# Patient Record
Sex: Female | Born: 1970 | Race: White | Hispanic: No | Smoking: Former smoker
Health system: Southern US, Community
[De-identification: ages and names within clinical notes are randomized; demographics above are authoritative.]

## PROBLEM LIST (undated history)

## (undated) DIAGNOSIS — J45909 Unspecified asthma, uncomplicated: Secondary | ICD-10-CM

## (undated) HISTORY — DX: Unspecified asthma, uncomplicated: J45.909

---

## 2004-10-02 ENCOUNTER — Other Ambulatory Visit: Admission: RE | Admit: 2004-10-02 | Discharge: 2004-10-02 | Payer: Self-pay | Admitting: Obstetrics and Gynecology

## 2005-04-20 ENCOUNTER — Ambulatory Visit (HOSPITAL_COMMUNITY): Admission: RE | Admit: 2005-04-20 | Discharge: 2005-04-20 | Payer: Self-pay | Admitting: Obstetrics and Gynecology

## 2010-01-20 ENCOUNTER — Encounter: Admission: RE | Admit: 2010-01-20 | Discharge: 2010-01-20 | Payer: Self-pay | Admitting: Family Medicine

## 2016-12-31 ENCOUNTER — Ambulatory Visit (INDEPENDENT_AMBULATORY_CARE_PROVIDER_SITE_OTHER): Payer: 59 | Admitting: Allergy and Immunology

## 2016-12-31 ENCOUNTER — Encounter: Payer: Self-pay | Admitting: Allergy and Immunology

## 2016-12-31 VITALS — BP 110/72 | HR 78 | Temp 98.4°F | Resp 16 | Ht 65.0 in | Wt 136.6 lb

## 2016-12-31 DIAGNOSIS — J3089 Other allergic rhinitis: Secondary | ICD-10-CM | POA: Diagnosis not present

## 2016-12-31 DIAGNOSIS — Z91018 Allergy to other foods: Secondary | ICD-10-CM | POA: Diagnosis not present

## 2016-12-31 MED ORDER — FLUTICASONE PROPIONATE 50 MCG/ACT NA SUSP: 2.0000 | Freq: Every day | NASAL | 5 refills | Status: DC | PRN

## 2016-12-31 NOTE — Progress Notes (Signed)
New Patient Note  RE: [name] MRN: [id number] DOB: 1970-10-25 Date of Office Visit: 12/31/2016  Referring provider: Charlies Silvers, PA* Primary care provider: Charlies Silvers, PA-C  Chief Complaint: Food Intolerance and Nasal Congestion   History of present illness: Lynn Mcdonald is a 46 y.o. female presenting today for evaluation of possible food allergies and rhinitis.  She complains of episodic abdominal pain as well as belching and occasional bloating.  She is unable to identify any specific food triggers for these GI symptoms, however is interested in assessing her food allergy status.  She reports a long time ago she may have experienced mild oral pruritus with the consumption of eggs.  She currently avoids eggs.  She experiences nasal congestion, sneezing, dry eyes, and occasional rhinorrhea and nasal pruritus.   Assessment and plan: History of food allergy Gastrointestinal symptoms, uncertain etiology. Skin tests to select food allergens were negative today. The negative predictive value of food allergen skin testing is excellent (approximately 95%). While this does not appear to be an IgE mediated issue, skin testing does not rule out food intolerances or cell-mediated enteropathies which may lend to GI symptoms. These etiologies are suggested when elimination of the responsible food leads to symptom resolution and re-introduction of the food is followed by the return of symptoms.   Open graded oral challenge for egg has been offered.  The patient has been encouraged to keep a careful symptom/food journal and eliminate any food suspected of correlating with symptoms.   If GI symptoms persist or progress, gastroenterologist evaluation may be warranted.  Perennial and seasonal allergic rhinitis  Aeroallergen avoidance measures have been discussed and provided in written form.  A prescription has been provided for fluticasone nasal spray, 2 sprays per nostril  daily as needed. Proper nasal spray technique has been discussed and demonstrated.  I have also recommended nasal saline spray (i.e., Simply Saline) or nasal saline lavage (i.e., NeilMed) as needed and prior to medicated nasal sprays.  For thick post nasal drainage, add guaifenesin [phone] mg (Mucinex)  twice daily as needed with adequate hydration as discussed.   Meds ordered this encounter  Medications  . fluticasone (FLONASE ALLERGY RELIEF) 50 MCG/ACT nasal spray    Sig: Place 2 sprays into both nostrils daily as needed for allergies or rhinitis.    Dispense:  16 g    Refill:  5    Diagnostics: Environmental epiutaneous testing:  Negative despite a positive histamine control. Environmental intradermal testing: Positive to tree pollen, molds, cockroach antigen, and dust mite antigen. Food allergen skin testing:  Negative despite a positive histamine control.    Physical examination: Blood pressure 110/72, pulse 78, temperature 98.4 F (36.9 C), temperature source Oral, resp. rate 16, height  (1.651 m), weight 136 lb 9.6 oz (62 kg), SpO2 97 %.  General: Alert, interactive, in no acute distress. HEENT: TMs pearly gray, turbinates moderately edematous without discharge, post-pharynx unremarkable. Neck: Supple without lymphadenopathy. Lungs: Clear to auscultation without wheezing, rhonchi or rales. CV: Normal S1, S2 without murmurs. Abdomen: Nondistended, nontender. Skin: Warm and dry, without lesions or rashes. Extremities:  No clubbing, cyanosis or edema. Neuro:   Grossly intact.  Review of systems:  Review of systems negative except as noted in HPI / PMHx or noted below: Review of Systems  Constitutional: Negative.   HENT: Negative.   Eyes: Negative.   Respiratory: Negative.   Cardiovascular: Negative.   Gastrointestinal: Negative.   Genitourinary: Negative.   Musculoskeletal: Negative.  Skin: Negative.   Neurological: Negative.   Endo/Heme/Allergies: Negative.    Psychiatric/Behavioral: Negative.     Past medical history:  Past Medical History:  Diagnosis Date  . Asthma    around 46 years old, and nothing since.    Past surgical history:  History reviewed. No pertinent surgical history.  Family history: Family History  Problem Relation Age of Onset  . Allergic rhinitis Neg Hx   . Angioedema Neg Hx   . Asthma Neg Hx   . Eczema Neg Hx   . Immunodeficiency Neg Hx   . Urticaria Neg Hx     Social history: Social History   Social History  . Marital status: Single    Spouse name: N/A  . Number of children: N/A  . Years of education: N/A   Occupational History  . Not on file.   Social History Main Topics  . Smoking status: Former Smoker    Quit date: 01/01/1995  . Smokeless tobacco: Never Used  . Alcohol use Not on file  . Drug use: Unknown  . Sexual activity: Not on file   Other Topics Concern  . Not on file   Social History Narrative  . No narrative on file   Environmental History: The patient lives in a 44 year old house with carpeting in the bedroom and central air/heat.  There are 2 cats in the house which have access to her bedroom.  She is a nonsmoker.  There is no known mold/water damage in the home.  Allergies as of 12/31/2016      Reactions   Eggs Or Egg-derived Products Itching   tongue   Sulfa Antibiotics Itching   tongue      Medication List       Accurate as of 12/31/16  9:23 PM. Always use your most recent med list.          fluticasone 50 MCG/ACT nasal spray Commonly known as:  FLONASE ALLERGY RELIEF Place 2 sprays into both nostrils daily as needed for allergies or rhinitis.            Discharge Care Instructions        Start     Ordered   12/31/16 0000  Allergy Test    Question:  Allergy test to perform  Answer:  1-59,1-66   12/31/16 1544   12/31/16 0000  Interdermal Allergy Test    Question Answer Comment  Allergens Control   Allergens [place]   Allergens Johnson   Allergens  7 Grass   Allergens Weed Mix   Allergens Tree Mix   Allergens Cockroach   Allergens Dog   Allergens Cat   Allergens Mold 4   Allergens Mold 3   Allergens Ragweed Mix   Allergens Mold 2   Allergens Mold 1   Allergens Mite Mix      12/31/16 1544   12/31/16 0000  fluticasone (FLONASE ALLERGY RELIEF) 50 MCG/ACT nasal spray  Daily PRN     12/31/16 1544      Known medication allergies: Allergies  Allergen Reactions  . Eggs Or Egg-Derived Products Itching    tongue  . Sulfa Antibiotics Itching    tongue    I appreciate the opportunity to take part in Nury's care. Please do not hesitate to contact me with questions.  Sincerely,   R. Jorene Guestarter Mitsue Peery, MD

## 2016-12-31 NOTE — Assessment & Plan Note (Addendum)
Gastrointestinal symptoms, uncertain etiology. Skin tests to select food allergens were negative today. The negative predictive value of food allergen skin testing is excellent (approximately 95%). While this does not appear to be an IgE mediated issue, skin testing does not rule out food intolerances or cell-mediated enteropathies which may lend to GI symptoms. These etiologies are suggested when elimination of the responsible food leads to symptom resolution and re-introduction of the food is followed by the return of symptoms.   Open graded oral challenge for egg has been offered.  The patient has been encouraged to keep a careful symptom/food journal and eliminate any food suspected of correlating with symptoms.   If GI symptoms persist or progress, gastroenterologist evaluation may be warranted.

## 2016-12-31 NOTE — Assessment & Plan Note (Signed)
   Aeroallergen avoidance measures have been discussed and provided in written form.  A prescription has been provided for fluticasone nasal spray, 2 sprays per nostril daily as needed. Proper nasal spray technique has been discussed and demonstrated.  I have also recommended nasal saline spray (i.e., Simply Saline) or nasal saline lavage (i.e., NeilMed) as needed and prior to medicated nasal sprays.  For thick post nasal drainage, add guaifenesin [phone] mg (Mucinex)  twice daily as needed with adequate hydration as discussed.

## 2016-12-31 NOTE — Patient Instructions (Addendum)
History of food allergy Gastrointestinal symptoms, uncertain etiology. Skin tests to select food allergens were negative today. The negative predictive value of food allergen skin testing is excellent (approximately 95%). While this does not appear to be an IgE mediated issue, skin testing does not rule out food intolerances or cell-mediated enteropathies which may lend to GI symptoms. These etiologies are suggested when elimination of the responsible food leads to symptom resolution and re-introduction of the food is followed by the return of symptoms.   Open graded oral challenge for egg has been offered.  The patient has been encouraged to keep a careful symptom/food journal and eliminate any food suspected of correlating with symptoms.   If GI symptoms persist or progress, gastroenterologist evaluation may be warranted.  Perennial and seasonal allergic rhinitis  Aeroallergen avoidance measures have been discussed and provided in written form.  A prescription has been provided for fluticasone nasal spray, 2 sprays per nostril daily as needed. Proper nasal spray technique has been discussed and demonstrated.  I have also recommended nasal saline spray (i.e., Simply Saline) or nasal saline lavage (i.e., NeilMed) as needed and prior to medicated nasal sprays.  For thick post nasal drainage, add guaifenesin [phone] mg (Mucinex)  twice daily as needed with adequate hydration as discussed.   Return if symptoms worsen or fail to improve.   Control of House Dust Mite Allergen  House dust mites play a major role in allergic asthma and rhinitis.  They occur in environments with high humidity wherever human skin, the food for dust mites is found. High levels have been detected in dust obtained from mattresses, pillows, carpets, upholstered furniture, bed covers, clothes and soft toys.  The principal allergen of the house dust mite is found in its feces.  A gram of dust may contain 1,000 mites and  250,000 fecal particles.  Mite antigen is easily measured in the air during house cleaning activities.    1. Encase mattresses, including the box spring, and pillow, in an air tight cover.  Seal the zipper end of the encased mattresses with wide adhesive tape. 2. Wash the bedding in water of 130 degrees Farenheit weekly.  Avoid cotton comforters/quilts and flannel bedding: the most ideal bed covering is the dacron comforter. 3. Remove all upholstered furniture from the bedroom. 4. Remove carpets, carpet padding, rugs, and non-washable window drapes from the bedroom.  Wash drapes weekly or use plastic window coverings. 5. Remove all non-washable stuffed toys from the bedroom.  Wash stuffed toys weekly. 6. Have the room cleaned frequently with a vacuum cleaner and a damp dust-mop.  The patient should not be in a room which is being cleaned and should wait 1 hour after cleaning before going into the room. 7. Close and seal all heating outlets in the bedroom.  Otherwise, the room will become filled with dust-laden air.  An electric heater can be used to heat the room. 8. Reduce indoor humidity to less than 50%.  Do not use a humidifier.   Reducing Pollen Exposure  The American Academy of Allergy, Asthma and Immunology suggests the following steps to reduce your exposure to pollen during allergy seasons.    1. Do not hang sheets or clothing out to dry; pollen may collect on these items. 2. Do not mow lawns or spend time around freshly cut grass; mowing stirs up pollen. 3. Keep windows closed at night.  Keep car windows closed while driving. 4. Minimize morning activities outdoors, a time when pollen counts are  usually at their highest. 5. Stay indoors as much as possible when pollen counts or humidity is high and on windy days when pollen tends to remain in the air longer. 6. Use air conditioning when possible.  Many air conditioners have filters that trap the pollen spores. 7. Use a HEPA room air  filter to remove pollen form the indoor air you breathe.   Control of Mold Allergen  Mold and fungi can grow on a variety of surfaces provided certain temperature and moisture conditions exist.  Outdoor molds grow on plants, decaying vegetation and soil.  The major outdoor mold, Alternaria and Cladosporium, are found in very high numbers during hot and dry conditions.  Generally, a late Summer - Fall peak is seen for common outdoor fungal spores.  Rain will temporarily lower outdoor mold spore count, but counts rise rapidly when the rainy period ends.  The most important indoor molds are Aspergillus and Penicillium.  Dark, humid and poorly ventilated basements are ideal sites for mold growth.  The next most common sites of mold growth are the bathroom and the kitchen.  Outdoor [employer] 1. Use air conditioning and keep windows closed 2. Avoid exposure to decaying vegetation. 3. Avoid leaf raking. 4. Avoid grain handling. 5. Consider wearing a face mask if working in moldy areas.  Indoor Mold Control 1. Maintain humidity below 50%. 2. Clean washable surfaces with 5% bleach solution. 3. Remove sources e.g. Contaminated carpets.  Control of Cockroach Allergen  Cockroach allergen has been identified as an important cause of acute attacks of asthma, especially in urban settings.  There are fifty-five species of cockroach that exist in the [place], however only three, the [place], [place] species produce allergen that can affect patients with Asthma.  Allergens can be obtained from fecal particles, egg casings and secretions from cockroaches.    1. Remove food sources. 2. Reduce access to water. 3. Seal access and entry points. 4. Spray runways with 0.5-1% Diazinon or Chlorpyrifos 5. Blow boric acid power under stoves and refrigerator. 6. Place bait stations (hydramethylnon) at feeding sites.

## 2019-09-04 ENCOUNTER — Other Ambulatory Visit: Payer: Self-pay | Admitting: Family Medicine

## 2019-09-04 ENCOUNTER — Other Ambulatory Visit: Payer: Self-pay

## 2019-09-04 ENCOUNTER — Ambulatory Visit
Admission: RE | Admit: 2019-09-04 | Discharge: 2019-09-04 | Disposition: A | Discharge status: Home / Self Care | Payer: 59 | Source: Ambulatory Visit | Attending: Family Medicine | Admitting: Family Medicine

## 2019-09-04 DIAGNOSIS — M545 Low back pain, unspecified: Secondary | ICD-10-CM

## 2021-08-18 ENCOUNTER — Inpatient Hospital Stay (HOSPITAL_BASED_OUTPATIENT_CLINIC_OR_DEPARTMENT_OTHER)
Admission: EM | Admit: 2021-08-18 | Discharge: 2021-08-20 | DRG: 684 | Disposition: A | Discharge status: Home / Self Care | Payer: 59 | Attending: Student | Admitting: Student

## 2021-08-18 ENCOUNTER — Observation Stay (HOSPITAL_COMMUNITY): Payer: 59

## 2021-08-18 ENCOUNTER — Emergency Department (HOSPITAL_BASED_OUTPATIENT_CLINIC_OR_DEPARTMENT_OTHER): Payer: 59 | Admitting: Radiology

## 2021-08-18 ENCOUNTER — Encounter (HOSPITAL_BASED_OUTPATIENT_CLINIC_OR_DEPARTMENT_OTHER): Payer: Self-pay | Admitting: Emergency Medicine

## 2021-08-18 ENCOUNTER — Other Ambulatory Visit: Payer: Self-pay

## 2021-08-18 DIAGNOSIS — F4320 Adjustment disorder, unspecified: Secondary | ICD-10-CM | POA: Diagnosis not present

## 2021-08-18 DIAGNOSIS — N179 Acute kidney failure, unspecified: Principal | ICD-10-CM | POA: Diagnosis present

## 2021-08-18 DIAGNOSIS — R7989 Other specified abnormal findings of blood chemistry: Secondary | ICD-10-CM

## 2021-08-18 DIAGNOSIS — R319 Hematuria, unspecified: Secondary | ICD-10-CM | POA: Diagnosis present

## 2021-08-18 DIAGNOSIS — W57XXXA Bitten or stung by nonvenomous insect and other nonvenomous arthropods, initial encounter: Secondary | ICD-10-CM | POA: Diagnosis present

## 2021-08-18 DIAGNOSIS — Z634 Disappearance and death of family member: Secondary | ICD-10-CM | POA: Diagnosis not present

## 2021-08-18 DIAGNOSIS — J302 Other seasonal allergic rhinitis: Secondary | ICD-10-CM | POA: Diagnosis not present

## 2021-08-18 DIAGNOSIS — Z79899 Other long term (current) drug therapy: Secondary | ICD-10-CM | POA: Diagnosis not present

## 2021-08-18 DIAGNOSIS — Z882 Allergy status to sulfonamides status: Secondary | ICD-10-CM | POA: Diagnosis not present

## 2021-08-18 DIAGNOSIS — Z86718 Personal history of other venous thrombosis and embolism: Secondary | ICD-10-CM | POA: Diagnosis not present

## 2021-08-18 DIAGNOSIS — I824Z2 Acute embolism and thrombosis of unspecified deep veins of left distal lower extremity: Secondary | ICD-10-CM

## 2021-08-18 DIAGNOSIS — Z91012 Allergy to eggs: Secondary | ICD-10-CM

## 2021-08-18 DIAGNOSIS — D649 Anemia, unspecified: Secondary | ICD-10-CM

## 2021-08-18 DIAGNOSIS — F4329 Adjustment disorder with other symptoms: Secondary | ICD-10-CM

## 2021-08-18 DIAGNOSIS — Z87891 Personal history of nicotine dependence: Secondary | ICD-10-CM

## 2021-08-18 LAB — CBC WITH DIFFERENTIAL/PLATELET
Abs Immature Granulocytes: 0.03 10*3/uL (ref 0.00–0.07)
Basophils Absolute: 0 10*3/uL (ref 0.0–0.1)
Basophils Relative: 0 %
Eosinophils Absolute: 0.1 10*3/uL (ref 0.0–0.5)
Eosinophils Relative: 1 %
HCT: 32.8 % — ABNORMAL LOW (ref 36.0–46.0)
Hemoglobin: 10.9 g/dL — ABNORMAL LOW (ref 12.0–15.0)
Immature Granulocytes: 0 %
Lymphocytes Relative: 11 %
Lymphs Abs: 0.8 10*3/uL (ref 0.7–4.0)
MCH: 30.6 pg (ref 26.0–34.0)
MCHC: 33.2 g/dL (ref 30.0–36.0)
MCV: 92.1 fL (ref 80.0–100.0)
Monocytes Absolute: 0.4 10*3/uL (ref 0.1–1.0)
Monocytes Relative: 7 %
Neutro Abs: 5.3 10*3/uL (ref 1.7–7.7)
Neutrophils Relative %: 81 %
Platelets: 237 10*3/uL (ref 150–400)
RBC: 3.56 MIL/uL — ABNORMAL LOW (ref 3.87–5.11)
RDW: 11.4 % — ABNORMAL LOW (ref 11.5–15.5)
WBC: 6.7 10*3/uL (ref 4.0–10.5)
nRBC: 0 % (ref 0.0–0.2)

## 2021-08-18 LAB — URINALYSIS, ROUTINE W REFLEX MICROSCOPIC
Bilirubin Urine: NEGATIVE
Glucose, UA: NEGATIVE mg/dL
Ketones, ur: NEGATIVE mg/dL
Nitrite: NEGATIVE
Protein, ur: 30 mg/dL — AB
Specific Gravity, Urine: 1.008 (ref 1.005–1.030)
pH: 7.5 (ref 5.0–8.0)

## 2021-08-18 LAB — COMPREHENSIVE METABOLIC PANEL
ALT: 9 U/L (ref 0–44)
AST: 18 U/L (ref 15–41)
Albumin: 3.4 g/dL — ABNORMAL LOW (ref 3.5–5.0)
Alkaline Phosphatase: 109 U/L (ref 38–126)
Anion gap: 10 (ref 5–15)
BUN: 36 mg/dL — ABNORMAL HIGH (ref 6–20)
CO2: 23 mmol/L (ref 22–32)
Calcium: 9.5 mg/dL (ref 8.9–10.3)
Chloride: 100 mmol/L (ref 98–111)
Creatinine, Ser: 2.31 mg/dL — ABNORMAL HIGH (ref 0.44–1.00)
GFR, Estimated: 25 mL/min — ABNORMAL LOW (ref >60)
Glucose, Bld: 125 mg/dL — ABNORMAL HIGH (ref 70–99)
Potassium: 4.7 mmol/L (ref 3.5–5.1)
Sodium: 133 mmol/L — ABNORMAL LOW (ref 135–145)
Total Bilirubin: 0.5 mg/dL (ref 0.3–1.2)
Total Protein: 6.7 g/dL (ref 6.5–8.1)

## 2021-08-18 LAB — PREGNANCY, URINE: Preg Test, Ur: NEGATIVE

## 2021-08-18 LAB — D-DIMER, QUANTITATIVE: D-Dimer, Quant: 1.91 ug/mL-FEU — ABNORMAL HIGH (ref 0.00–0.50)

## 2021-08-18 LAB — SEDIMENTATION RATE: Sed Rate: 40 mm/hr — ABNORMAL HIGH (ref 0–22)

## 2021-08-18 MED ORDER — ACETAMINOPHEN 650 MG RE SUPP: 650.0000 mg | Freq: Four times a day (QID) | RECTAL | Status: DC | PRN

## 2021-08-18 MED ORDER — SODIUM CHLORIDE 0.9 % IV SOLN: INTRAVENOUS | Status: AC

## 2021-08-18 MED ORDER — LACTATED RINGERS IV BOLUS
1000.0000 mL | Freq: Once | INTRAVENOUS | Status: AC
  Administered 2021-08-18: 1000 mL via INTRAVENOUS

## 2021-08-18 MED ORDER — ACETAMINOPHEN 500 MG PO TABS
1000.0000 mg | ORAL_TABLET | Freq: Four times a day (QID) | ORAL | Status: DC | PRN
  Administered 2021-08-19: 1000 mg via ORAL
  Administered 2021-08-19 – 2021-08-20 (×3): 500 mg via ORAL
  Filled 2021-08-18 (×5): qty 2

## 2021-08-18 MED ORDER — DOXYCYCLINE HYCLATE 100 MG PO TABS
100.0000 mg | ORAL_TABLET | Freq: Two times a day (BID) | ORAL | Status: DC
Start: 2021-08-19 — End: 2021-08-19

## 2021-08-18 MED ORDER — ONDANSETRON HCL 4 MG/2ML IJ SOLN: 4.0000 mg | Freq: Four times a day (QID) | INTRAMUSCULAR | Status: DC | PRN

## 2021-08-18 MED ORDER — ENOXAPARIN SODIUM 30 MG/0.3ML IJ SOSY
30.0000 mg | PREFILLED_SYRINGE | INTRAMUSCULAR | Status: DC
  Administered 2021-08-18 – 2021-08-19 (×2): 30 mg via SUBCUTANEOUS
  Filled 2021-08-18 (×2): qty 0.3

## 2021-08-18 MED ORDER — LACTATED RINGERS IV SOLN: INTRAVENOUS | Status: DC

## 2021-08-18 MED ORDER — DOXYCYCLINE HYCLATE 100 MG PO TABS
200.0000 mg | ORAL_TABLET | Freq: Once | ORAL | Status: AC
  Administered 2021-08-18: 200 mg via ORAL
  Filled 2021-08-18: qty 2

## 2021-08-18 MED ORDER — SENNOSIDES-DOCUSATE SODIUM 8.6-50 MG PO TABS: 1.0000 | ORAL_TABLET | Freq: Every evening | ORAL | Status: DC | PRN

## 2021-08-18 MED ORDER — ONDANSETRON HCL 4 MG PO TABS: 4.0000 mg | ORAL_TABLET | Freq: Four times a day (QID) | ORAL | Status: DC | PRN

## 2021-08-18 NOTE — ED Notes (Signed)
Report given to the Floor RN. 

## 2021-08-18 NOTE — H&P (Signed)
?History and Physical  ? ? ?Lynn Mcdonald YPP:[id number] DOB: [birth year] DOA: 08/18/2021 ? ?PCP: Lazoff, Lynn Mcdonald  ?Patient coming from: Home ? ?I have personally briefly reviewed patient's old medical records in [place] ? ?Chief Complaint: AKI, fevers, joint pain ? ?HPI: ?Lynn Mcdonald is a 51 y.o. female with medical history significant for childhood asthma and seasonal allergies who presented to the ED for evaluation of AKI. ? ?Patient initially was seen at urgent care 07/17/2021 as a telemedicine visit for 2 days of fever followed by new nonpruritic rash which began on her thighs and spread to her chest and back of the arms.  She had reported coughing with intermittent wheezing as well without shortness of breath.  Symptoms were felt related to viral URI with viral exanthem.  She was recommended to continue supportive care. ? ?She was seen by her PCP on 08/02/2021 for continued symptoms and recurrent fever.  She was started on a 10-day course of doxycycline to treat sinusitis.  She does note that she did have photosensitivity as well as nausea while on doxycycline. ? ?On 4/21 she called her PCP with persistent symptoms after completing course of doxycycline.  She was then prescribed a 7-day course of Augmentin. ? ?She was seen again in office with her PCP on 4/27.  At this time she reported that she found a tick on her previous night.  She had continued low-grade fevers, polyarthralgia, poor appetite.  Monospot test was negative.  ESR 37, CRP 17.3.  Creatinine was found to be 2.64 compared to previous 0.72 (12/14/2019).  She was called today after labs resulted to present to the ED for further evaluation. ? ?Patient states that her rash has completely resolved now.  She says she found a tick in her left posterior upper leg/gluteal area 2 nights ago, she is not sure how long tick was attached.  She still has migrating joint pain and low-grade fevers with chills.  She still has poor appetite.  She denies  any decrease in urine output or dysuria.  She denies any emesis, abdominal pain, or peripheral edema.  She denies any recent travel.  She has 2 pet cats. ? ?[place] ED Course  Labs/Imaging on admission: I have personally reviewed following labs and imaging studies. ? ?Initial vitals showed BP 152/64, pulse 106, RR 18, temp 98.2 ?F, SPO2 100% on room air. ? ?Labs show BUN 36, creatinine 2.31, serum glucose 125, sodium 133, potassium 4.7, bicarb 23, LFTs within normal limits, WBC 6.7, hemoglobin 10.9, platelets 237,000, D-dimer 1.91, ESR 40. ? ?Urine pregnancy test negative.  Urinalysis shows negative nitrates, trace leukocytes, 21-50 RBC/hpf, 0-5 WBC/hpf. ? ?2 view chest x-ray negative for focal consolidation, edema, effusion. ? ?Patient was given 1 L LR and the hospitalist service was consulted to admit for further evaluation and management. ? ?Review of Systems: All systems reviewed and are negative except as documented in history of present illness above. ? ? ?Past Medical History:  ?Diagnosis Date  ? Asthma   ? around 51 years old, and nothing since.  ? ? ?History reviewed. No pertinent surgical history. ? ?Social History: ? reports that she quit smoking about 26 years ago. She has never used smokeless tobacco. No history on file for alcohol use and drug use. ? ?Allergies  ?Allergen Reactions  ? Eggs Or Egg-Derived Products Itching  ?  tongue  ? Sulfa Antibiotics Itching  ?  tongue  ? ? ?Family History  ?Problem Relation  Age of Onset  ? Allergic rhinitis Neg Hx   ? Angioedema Neg Hx   ? Asthma Neg Hx   ? Eczema Neg Hx   ? Immunodeficiency Neg Hx   ? Urticaria Neg Hx   ? ? ? ?Prior to Admission medications   ?Medication Sig Start Date End Date Taking? Authorizing Provider  ?fluticasone (FLONASE ALLERGY RELIEF) 50 MCG/ACT nasal spray Place 2 sprays into both nostrils daily as needed for allergies or rhinitis. 12/31/16   Bobbitt, Sedalia Muta, MD  ? ? ?Physical Exam: ?Vitals:  ? 08/18/21 1600 08/18/21  1700 08/18/21 1832 08/18/21 2002  ?BP: 135/75 132/62 (!) 135/55 (!) 132/59  ?Pulse: 90 90 96 90  ?Resp: 20 18 18 19   ?Temp:   98.7 ?F (37.1 ?C) 99.2 ?F (37.3 ?C)  ?TempSrc:   Oral Oral  ?SpO2: 97% 98% 100% 100%  ?Weight:   60.3 kg   ?Height:   5' 3"  (1.6 m)   ? ?Constitutional: Resting in bed, NAD, calm, comfortable ?Eyes: EOMI, lids and conjunctivae normal ?ENMT: Mucous membranes are moist. Posterior pharynx clear of any exudate or lesions.Normal dentition.  ?Neck: normal, supple, no masses. ?Respiratory: clear to auscultation bilaterally, no wheezing, no crackles. Normal respiratory effort. No accessory muscle use.  ?Cardiovascular: Regular rate and rhythm, no murmurs / rubs / gallops. No extremity edema. 2+ pedal pulses. ?Abdomen: no tenderness, no masses palpated. No hepatosplenomegaly. Bowel sounds positive.  ?Musculoskeletal: no clubbing / cyanosis. No joint deformity upper and lower extremities. Good ROM, no contractures. Normal muscle tone.  ?Skin: no rashes, lesions, ulcers. No induration ?Neurologic: Sensation intact. Strength 5/5 in all 4.  ?Psychiatric: Normal judgment and insight. Alert and oriented x 3. Normal mood.  ? ?EKG: Personally reviewed. Normal sinus rhythm without acute ischemic changes.  No prior for comparison. ? ?Assessment/Plan ?Principal Problem: ?  AKI (acute kidney injury) ([place]) ?Active Problems: ?  Tick-borne illness ?  Elevated d-dimer ?  ?Lynn Mcdonald is a 51 y.o. female with medical history significant for childhood asthma and seasonal allergies who is admitted with AKI and suspected tickborne illness. ? ?Assessment and Plan: ?* AKI (acute kidney injury) ([place]) ?Creatinine 2.31 on admission, last creatinine was 0.72 in August 2021.  Suspect some degree of hypovolemia due to poor oral intake as well as possible ATN in setting of presumed tickborne illness. ?-Continue IV fluid hydration overnight ?-Obtain renal ultrasound ?-Check urine sodium and creatinine ?-Avoid NSAIDs ?-Repeat  labs in a.m. ? ?Tick-borne illness ?Patient with initial fevers followed by petechial rash (involving thighs, chest, posterior arms) which has since resolved.  Has intermittent recurrent fevers and migrating polyarthralgia.  She found a tick on her person 2 nights prior to admission.  Clinical picture suspicious for tickborne illness such as [facility] spotted fever.  She denies any recent travel. ?-Give doxycycline 200 mg once followed by 100 mg twice daily ?-Follow RMSF labs ? ?Elevated d-dimer ?D-dimer elevated at 1.91.  Likely related to presumed tickborne illness with polyarthralgia.  She denies any dyspnea and is not tachycardic, low suspicion for PE at this time. ? ?DVT prophylaxis: enoxaparin (LOVENOX) injection 30 mg Start: 08/18/21 2200 ?Code Status: Full code, confirmed on admission. ?Family Communication: Discussed with patient's friend at bedside. ?Disposition Plan: From home and likely discharge to home pending clinical progress. ?Consults called: None ?Severity of Illness: ?The appropriate patient status for this patient is OBSERVATION. Observation status is judged to be reasonable and necessary in order to provide the required intensity of  service to ensure the patient's safety. The patient's presenting symptoms, physical exam findings, and initial radiographic and laboratory data in the context of their medical condition is felt to place them at decreased risk for further clinical deterioration. Furthermore, it is anticipated that the patient will be medically stable for discharge from the hospital within 2 midnights of admission.   ?Zada Finders MD ?[facility] ? ?If 7PM-7AM, please contact night-coverage ?[email or web address] ? ?08/18/2021, 8:05 PM  ?

## 2021-08-18 NOTE — Hospital Course (Addendum)
51 year old F with PMH of childhood asthma and seasonal allergies directed to ED by PCP due to AKI.  Creatinine 2.6 at PCP office.  Her creatinine of 0.72 in 11/2019.  Suspect some element of CKD based on renal US.  Recently treated for possible sinusitis and viral URI with doxycycline and then with Augmentin.  There is also concern about tick bite disease.  She had elevated inflammatory markers.  Seems Lyme and RMSF serologies were ordered by PCP.  ? ?Creatinine improved to 2.0 with IV fluid.  She has large Hgb with 20-50 RBC per hpf on UA.  Nephrology consulted, and ordered autoimmune and inflammatory labs for further evaluation of AKI and possible CKD.  Nephrology to arrange outpatient follow-up in 2 weeks. ? ?Of note, patient reports a lot of stress lately both at work and home.  She says she lost her husband about a year ago.  She is a single mom.  She says she might be depressed but does not have suicidal or homicidal thought.  She says her friend advised her to seek therapy that she did not follow through.  She also seems to have some menopausal signs.  Obviously, current diagnosis of renal failure and DVT did not help.  She seems overwhelmed.  I have encouraged her to talk to PCP.  I think she would benefit from therapy at minimum.  SSRI could help as well. ? ?

## 2021-08-18 NOTE — Assessment & Plan Note (Addendum)
Recent Labs  ?  08/18/21 ?1435 08/19/21 ?0439 08/20/21 ?0427  ?BUN 36* 30* 30*  ?CREATININE 2.31* 2.14* 2.03*  ?Unknown baseline. Cr 2.64 at PCP office on 4/27, was 0.72 in August 2021.  Not on nephrotoxic meds except for occasional NSAID.  Recently had some constitutional symptoms with subjective fever, chills, arthralgia, hot flash... She also have elevated inflammatory markers.  Seen by PCP recently and diagnosed with URI with viral exanthem and sinusitis for which she was treated with doxycycline and then Augmentin.  UA with large Hgb 20-50 RBCs per hpf.  No proteinuria.  CK within normal.  Renal US suggested medical renal disease.  Evaluated by nephrology.  Autoimmune and inflammatory labs ordered.  Nephrology to follow-up on these labs and arrange outpatient follow-up in 2 weeks. ?

## 2021-08-18 NOTE — ED Triage Notes (Signed)
Pt reports being treated for (unknown) infection for past 2 weeks, and reports continued fatigue and low appetite.  Pt currently finishing course of amoxiclave.  Pt had blood drawn yesterday and reports she was notified of abnormal kidney function values and advised to report to ED for eval. ?

## 2021-08-18 NOTE — Assessment & Plan Note (Deleted)
No calf tenderness or respiratory symptoms prior to admission.  However, she felt left-sided chest pain with deep breathing last night.  No prior history of blood clot.  Not on HRT.  No history of cancer.  No recent surgery, long travel or immobilization.  LE Doppler  with age indeterminate deep vein thrombosis  ?involving the left peroneal veins.  VQ scan negative but cannot exclude small peripheral PEs. We have discussed about Doppler finding.  Although this does not warrant treatment, her risk of VTE is higher than normal in the future, and VTE prophylaxis with low-dose Xarelto would be reasonable. I have also recommended age-appropriate cancer screenings such as mammogram, Pap smear and colonoscopy. ?

## 2021-08-18 NOTE — ED Provider Notes (Signed)
?[facility] ?Provider Note ? ? ?CSN: [id number] ?Arrival date & time: 08/18/21  1346 ? ?  ? ?History ? ?Chief Complaint  ?Patient presents with  ? Fatigue  ? ? ?Lynn Mcdonald is a 51 y.o. female referred to the ED by her PCP for evaluation of elevated inflammatory markers and creatinine level.  Patient first went to her primary care doctor about 1.5 to 2 months ago for evaluation of multiple nonspecific complaints including night sweats, fevers, decreased appetite and rash across her anterior bilateral thighs, bilateral upper extremities and chest, sparing the palms and soles.  She had negative COVID test and symptoms were attributed to unknown viral etiology.  She was started on a 10-day course of doxycycline which she completed although did have some sun sensitivity exposure.  She was then given a 7-day course of Augmentin and she has 2 days left of this antibiotic remaining.  She continues to complain of decreased appetite, night sweats and "just not feeling well" despite the 2 courses of antibiotics.  At her recheck yesterday, labs identified elevated ESR, CRP, alk phos and creatinine of 2.46.  Patient was also noted to have find a tick on her body 2 days ago as well but is unsure how long it had been there.  Patient denies difficulty breathing, although she does note that she feels that she needs to take deeper breaths in order to feel as if she is getting enough air.  Currently she denies chest pain, abdominal pain, nausea, vomiting, diarrhea, lower extremity edema, urinary symptoms. ? ?HPI ? ?  ? ?Home Medications ?Prior to Admission medications   ?Medication Sig Start Date End Date Taking? Authorizing Provider  ?amoxicillin-clavulanate (AUGMENTIN) 875-125 MG tablet Take 1 tablet by mouth 2 (two) times daily. 08/11/21  Yes [provider]  ?naproxen sodium (ALEVE) 220 MG tablet Take 220 mg by mouth daily as needed (pain).   Yes [provider]   ?promethazine-dextromethorphan (PROMETHAZINE-DM) 6.25-15 MG/5ML syrup Take 5 mLs by mouth every 6 (six) hours as needed for cough. 07/17/21  Yes [provider]  ?fluticasone (FLONASE ALLERGY RELIEF) 50 MCG/ACT nasal spray Place 2 sprays into both nostrils daily as needed for allergies or rhinitis. ?Patient not taking: Reported on 08/18/2021 12/31/16   Bobbitt, Sedalia Muta, MD  ?   ? ?Allergies    ?Eggs or egg-derived products and Sulfa antibiotics   ? ?Review of Systems   ?Review of Systems ? ?Physical Exam ?Updated Vital Signs ?BP (!) 132/59 (BP Location: Left Arm)   Pulse 90   Temp 99.2 ?F (37.3 ?C) (Oral)   Resp 19   Ht 5' 3"  (1.6 m)   Wt 60.3 kg   SpO2 100%   BMI 23.54 kg/m?  ?Physical Exam ?Vitals and nursing note reviewed.  ?Constitutional:   ?   General: She is not in acute distress. ?   Appearance: She is not ill-appearing.  ?HENT:  ?   Head: Atraumatic.  ?Eyes:  ?   Conjunctiva/sclera: Conjunctivae normal.  ?Cardiovascular:  ?   Rate and Rhythm: Regular rhythm. Tachycardia present.  ?   Pulses:     ?     Radial pulses are 2+ on the right side and 1+ on the left side.  ?     Dorsalis pedis pulses are 2+ on the right side and 2+ on the left side.  ?   Heart sounds: No murmur heard. ?Pulmonary:  ?   Effort: Pulmonary effort is normal. No  respiratory distress.  ?   Breath sounds: Normal breath sounds.  ?Abdominal:  ?   General: Abdomen is flat. There is no distension.  ?   Palpations: Abdomen is soft.  ?   Tenderness: There is no abdominal tenderness.  ?Musculoskeletal:     ?   General: Normal range of motion.  ?   Cervical back: Normal range of motion.  ?   Right lower leg: No edema.  ?   Left lower leg: No edema.  ?Skin: ?   General: Skin is warm and dry.  ?   Capillary Refill: Capillary refill takes less than 2 seconds.  ?   Comments: Skin without rashes or lesions  ?Neurological:  ?   General: No focal deficit present.  ?   Mental Status: She is alert.  ?Psychiatric:     ?   Mood and Affect:  Mood normal.  ? ? ?ED Results / Procedures / Treatments   ?Labs ?(all labs ordered are listed, but only abnormal results are displayed) ?Labs Reviewed  ?URINALYSIS, ROUTINE W REFLEX MICROSCOPIC - Abnormal; Notable for the following components:  ?    Result Value  ? Hgb urine dipstick LARGE (*)   ? Protein, ur 30 (*)   ? Leukocytes,Ua TRACE (*)   ? All other components within normal limits  ?COMPREHENSIVE METABOLIC PANEL - Abnormal; Notable for the following components:  ? Sodium 133 (*)   ? Glucose, Bld 125 (*)   ? BUN 36 (*)   ? Creatinine, Ser 2.31 (*)   ? Albumin 3.4 (*)   ? GFR, Estimated 25 (*)   ? All other components within normal limits  ?D-DIMER, QUANTITATIVE - Abnormal; Notable for the following components:  ? D-Dimer, Quant 1.91 (*)   ? All other components within normal limits  ?CBC WITH DIFFERENTIAL/PLATELET - Abnormal; Notable for the following components:  ? RBC 3.56 (*)   ? Hemoglobin 10.9 (*)   ? HCT 32.8 (*)   ? RDW 11.4 (*)   ? All other components within normal limits  ?SEDIMENTATION RATE - Abnormal; Notable for the following components:  ? Sed Rate 40 (*)   ? All other components within normal limits  ?PREGNANCY, URINE  ?CBC WITH DIFFERENTIAL/PLATELET  ?C-REACTIVE PROTEIN  ?ROCKY MTN SPOTTED FVR ABS PNL(IGG+IGM)  ?HIV ANTIBODY (ROUTINE TESTING W REFLEX)  ?COMPREHENSIVE METABOLIC PANEL  ?CBC  ?SODIUM, URINE, RANDOM  ?CREATININE, URINE, RANDOM  ? ? ?EKG ?EKG Interpretation ? ?Date/Time:  Friday August 18 2021 14:41:02 EDT ?Ventricular Rate:  90 ?PR Interval:  148 ?QRS Duration: 79 ?QT Interval:  332 ?QTC Calculation: 407 ?R Axis:   73 ?Text Interpretation: Sinus rhythm Confirmed by Lennice Sites [phone]) on 08/18/2021 2:48:01 PM ? ?Radiology ?DG Chest 2 View ? ?Result Date: 08/18/2021 ?CLINICAL DATA:  Fatigue, concern for infection EXAM: CHEST - 2 VIEW COMPARISON:  None. FINDINGS: The heart size and mediastinal contours are within normal limits. Both lungs are clear. The visualized skeletal structures  are unremarkable. IMPRESSION: No active cardiopulmonary disease. Electronically Signed   By: Jerilynn Mages.  Shick M.D.   On: 08/18/2021 14:58   ? ?Procedures ?Procedures  ? ? ?Medications Ordered in ED ?Medications  ?0.9 %  sodium chloride infusion (has no administration in time range)  ?enoxaparin (LOVENOX) injection 30 mg (has no administration in time range)  ?acetaminophen (TYLENOL) tablet 1,000 mg (has no administration in time range)  ?  Or  ?acetaminophen (TYLENOL) suppository 650 mg (has no administration in time range)  ?  ondansetron (ZOFRAN) tablet 4 mg (has no administration in time range)  ?  Or  ?ondansetron (ZOFRAN) injection 4 mg (has no administration in time range)  ?senna-docusate (Senokot-S) tablet 1 tablet (has no administration in time range)  ?doxycycline (VIBRA-TABS) tablet 200 mg (has no administration in time range)  ?  Followed by  ?doxycycline (VIBRA-TABS) tablet 100 mg (has no administration in time range)  ?lactated ringers bolus 1,000 mL (0 mLs Intravenous Stopped 08/18/21 1759)  ? ? ?ED Course/ Medical Decision Making/ A&P ?  ?                        ?Medical Decision Making ?Amount and/or Complexity of Data Reviewed ?Labs: ordered. ?Radiology: ordered. ? ?Risk ?Decision regarding hospitalization. ? ? ?History:  ?Per HPI ?Social determinants of health:  ?Social History  ? ?Socioeconomic History  ? Marital status: Widowed  ?  Spouse name: Not on file  ? Number of children: Not on file  ? Years of education: Not on file  ? Highest education level: Not on file  ?Occupational History  ? Not on file  ?Tobacco Use  ? Smoking status: Former  ?  Types: Cigarettes  ?  Quit date: 01/01/1995  ?  Years since quitting: 26.6  ? Smokeless tobacco: Never  ?Substance and Sexual Activity  ? Alcohol use: Not on file  ? Drug use: Not on file  ? Sexual activity: Not on file  ?Other Topics Concern  ? Not on file  ?Social History Narrative  ? Not on file  ? ?Social Determinants of Health  ? ?Financial Resource Strain: Not  on file  ?Food Insecurity: Not on file  ?Transportation Needs: Not on file  ?Physical Activity: Not on file  ?Stress: Not on file  ?Social Connections: Not on file  ?Intimate Partner Violence: Not on file  ? ? ? ?Initial impr

## 2021-08-18 NOTE — ED Notes (Signed)
Report given to [facility]. 

## 2021-08-18 NOTE — ED Notes (Signed)
Pt encouraged to give a UA sample. Pt states, "I went before I came back here. I either need water or IV fluids". LR bolus started ?

## 2021-08-18 NOTE — Assessment & Plan Note (Addendum)
Unlikely.  She found a tick on her person 2 nights prior to admission.  The tick was small and not engorged.  She had constitutional symptoms before tick bite.  Some of her symptoms seems to be menopausal.  She also have age-indeterminate distal DVT.  ?-Follow serologies for Lyme and RMSF from PCP office ?-Discontinued doxycycline ?-Follow-up autoimmune and inflammatory labs ordered by nephrology ?

## 2021-08-19 ENCOUNTER — Observation Stay (HOSPITAL_COMMUNITY): Payer: 59

## 2021-08-19 DIAGNOSIS — R7989 Other specified abnormal findings of blood chemistry: Secondary | ICD-10-CM

## 2021-08-19 DIAGNOSIS — R319 Hematuria, unspecified: Secondary | ICD-10-CM | POA: Diagnosis present

## 2021-08-19 DIAGNOSIS — Z86718 Personal history of other venous thrombosis and embolism: Secondary | ICD-10-CM | POA: Diagnosis not present

## 2021-08-19 DIAGNOSIS — I824Z2 Acute embolism and thrombosis of unspecified deep veins of left distal lower extremity: Secondary | ICD-10-CM

## 2021-08-19 DIAGNOSIS — Z882 Allergy status to sulfonamides status: Secondary | ICD-10-CM | POA: Diagnosis not present

## 2021-08-19 DIAGNOSIS — F4320 Adjustment disorder, unspecified: Secondary | ICD-10-CM | POA: Diagnosis present

## 2021-08-19 DIAGNOSIS — Z634 Disappearance and death of family member: Secondary | ICD-10-CM | POA: Diagnosis not present

## 2021-08-19 DIAGNOSIS — J302 Other seasonal allergic rhinitis: Secondary | ICD-10-CM | POA: Diagnosis present

## 2021-08-19 DIAGNOSIS — N179 Acute kidney failure, unspecified: Secondary | ICD-10-CM

## 2021-08-19 DIAGNOSIS — Z87891 Personal history of nicotine dependence: Secondary | ICD-10-CM | POA: Diagnosis not present

## 2021-08-19 DIAGNOSIS — D649 Anemia, unspecified: Secondary | ICD-10-CM | POA: Diagnosis present

## 2021-08-19 DIAGNOSIS — Z91012 Allergy to eggs: Secondary | ICD-10-CM | POA: Diagnosis not present

## 2021-08-19 DIAGNOSIS — Z79899 Other long term (current) drug therapy: Secondary | ICD-10-CM | POA: Diagnosis not present

## 2021-08-19 LAB — CBC
HCT: 30.2 % — ABNORMAL LOW (ref 36.0–46.0)
Hemoglobin: 10.4 g/dL — ABNORMAL LOW (ref 12.0–15.0)
MCH: 32 pg (ref 26.0–34.0)
MCHC: 34.4 g/dL (ref 30.0–36.0)
MCV: 92.9 fL (ref 80.0–100.0)
Platelets: 213 10*3/uL (ref 150–400)
RBC: 3.25 MIL/uL — ABNORMAL LOW (ref 3.87–5.11)
RDW: 11.2 % — ABNORMAL LOW (ref 11.5–15.5)
WBC: 5.6 10*3/uL (ref 4.0–10.5)
nRBC: 0 % (ref 0.0–0.2)

## 2021-08-19 LAB — COMPREHENSIVE METABOLIC PANEL
ALT: 11 U/L (ref 0–44)
AST: 15 U/L (ref 15–41)
Albumin: 2.9 g/dL — ABNORMAL LOW (ref 3.5–5.0)
Alkaline Phosphatase: 110 U/L (ref 38–126)
Anion gap: 8 (ref 5–15)
BUN: 30 mg/dL — ABNORMAL HIGH (ref 6–20)
CO2: 24 mmol/L (ref 22–32)
Calcium: 8.6 mg/dL — ABNORMAL LOW (ref 8.9–10.3)
Chloride: 107 mmol/L (ref 98–111)
Creatinine, Ser: 2.14 mg/dL — ABNORMAL HIGH (ref 0.44–1.00)
GFR, Estimated: 28 mL/min — ABNORMAL LOW (ref >60)
Glucose, Bld: 88 mg/dL (ref 70–99)
Potassium: 4.2 mmol/L (ref 3.5–5.1)
Sodium: 139 mmol/L (ref 135–145)
Total Bilirubin: 0.9 mg/dL (ref 0.3–1.2)
Total Protein: 6 g/dL — ABNORMAL LOW (ref 6.5–8.1)

## 2021-08-19 LAB — SODIUM, URINE, RANDOM: Sodium, Ur: 47 mmol/L

## 2021-08-19 LAB — CK: Total CK: 75 U/L (ref 38–234)

## 2021-08-19 LAB — CREATININE, URINE, RANDOM: Creatinine, Urine: 43.57 mg/dL

## 2021-08-19 LAB — HIV ANTIBODY (ROUTINE TESTING W REFLEX): HIV Screen 4th Generation wRfx: NONREACTIVE

## 2021-08-19 MED ORDER — TECHNETIUM TO 99M ALBUMIN AGGREGATED: 4.2000 | Freq: Once | INTRAVENOUS | Status: DC

## 2021-08-19 MED ORDER — SODIUM CHLORIDE 0.9 % IV SOLN: INTRAVENOUS | Status: DC

## 2021-08-19 NOTE — Progress Notes (Signed)
Lower extremity venous duplex has been completed.  ?Results given to Lance,RN.  ? ?Preliminary results in CV Proc.  ? ?Shawni Volkov Jarmal Lewelling ?08/19/2021 10:48 AM    ?

## 2021-08-19 NOTE — Assessment & Plan Note (Deleted)
See elevated D-dimer for more. ?Follow-up VQ scan ?

## 2021-08-19 NOTE — Assessment & Plan Note (Addendum)
Recent Labs  ?  08/18/21 ?1458 08/19/21 ?0439 08/20/21 ?0427  ?HGB 10.9* 10.4* 10.1*  ?Check anemia panel in the morning ? ?

## 2021-08-19 NOTE — Progress Notes (Signed)
?PROGRESS NOTE ? ?SETH FRIEDLANDER CHY:[id number] DOB: December 02, 1970  ? ?PCP: Lazoff, Shawn P, DO ? ?Patient is from: Home. ? ?DOA: 08/18/2021 LOS: 0 ? ?Chief complaints ?Chief Complaint  ?Patient presents with  ? Fatigue  ?  ? ?Brief Narrative / Interim history: ?51 year old F with PMH of childhood asthma and seasonal allergies directed to ED by PCP due to AKI.  Recently treated for possible sinusitis and viral URI with doxycycline and then with Augmentin.  There is also concern about tick bite disease.  She had elevated inflammatory markers.  Lyme and RMSF serologies were ordered by PCP.   ? ?Subjective: ?Seen and examined earlier this morning.  No major events overnight of this morning.  She reports left-sided chest pain with deep breathing last night that has improved with Tylenol.  Currently rates her pain 2/10.  She denies shortness of breath.  She reports burping but denies heartburn or reflux.  Denies history of blood clot, asymmetric leg swelling or calf tenderness.  Not on HRT.  Admits to occasional NSAID use. ? ?Objective: ?Vitals:  ? 08/18/21 1832 08/18/21 2002 08/19/21 0026 08/19/21 0407  ?BP: (!) 135/55 (!) 132/59 131/63 112/63  ?Pulse: 96 90 95 87  ?Resp: 18 19 19 20   ?Temp: 98.7 ?F (37.1 ?C) 99.2 ?F (37.3 ?C) 98.5 ?F (36.9 ?C) 98.5 ?F (36.9 ?C)  ?TempSrc: Oral Oral Oral Oral  ?SpO2: 100% 100% 98% 96%  ?Weight: 60.3 kg     ?Height: 5' 3"  (1.6 m)     ? ? ?Examination: ? ?GENERAL: No apparent distress.  Nontoxic. ?HEENT: MMM.  Vision and hearing grossly intact.  ?NECK: Supple.  No apparent JVD.  ?RESP:  No IWOB.  Fair aeration bilaterally. ?CVS:  RRR. Heart sounds normal.  ?ABD/GI/GU: BS+. Abd soft, NTND.  ?MSK/EXT:  Moves extremities. No apparent deformity.  Trace BLE edema. ?SKIN: no apparent skin lesion or wound ?NEURO: Awake, alert and oriented appropriately.  No apparent focal neuro deficit. ?PSYCH: Appears anxious. ? ?Procedures:  ?None ? ?Microbiology summarized: ?None ? ?Assessment and Plan: ?* AKI  (acute kidney injury) ([place]) ?Recent Labs  ?  08/18/21 ?1435 08/19/21 ?0439  ?BUN 36* 30*  ?CREATININE 2.31* 2.14*  ?Unknown baseline. Cr 2.64 at PCP office on 4/27, was 0.72 in August 2021.  Not on nephrotoxic meds except for occasional NSAID.  Recently had some constitutional symptoms with subjective fever, chills, arthralgia, hot flash... CRP 17.3.  ESR 37.  Seen by PCP recently and diagnosed with URI with viral exanthem and sinusitis for which she was treated with doxycycline and then Augmentin.  UA with large Hgb but no RBCs.  No proteinuria.  Renal US suggested medical renal disease.  ?-Check CK ?-Continue IV fluid ?-Continue monitoring ?-Autoimmune work-up if no further improvement ? ?Tick-borne illness ?Unlikely.  She found a tick on her person 2 nights prior to admission.  The tick was small and not engorged.  She had constitutional symptoms before tick bite.  Some of her symptoms seems to be menopausal.  She also have age-indeterminate distal DVT.  ?-Follow serologies for Lyme and RMSF from PCP office ?-Discontinued doxycycline ? ?Normocytic anemia ?Recent Labs  ?  08/18/21 ?1458 08/19/21 ?0439  ?HGB 10.9* 10.4*  ?Check anemia panel in the morning ? ? ?Deep vein thrombosis (DVT) of distal vein of left lower extremity ([place]) ?See elevated D-dimer for more. ?Follow-up VQ scan ? ?Elevated d-dimer ?No calf tenderness or respiratory symptoms prior to admission.  However, she felt left-sided chest pain  with deep breathing last night.  No prior history of blood clot.  Not on HRT.  No history of cancer.  No recent surgery, long travel or immobilization.  LE Doppler  with age indeterminate deep vein thrombosis  ?involving the left peroneal veins. ?-Follow-up VQ scan. ?-Will discuss about risk and benefits of anticoagulation. ? ? ? ? ?DVT prophylaxis:  ?enoxaparin (LOVENOX) injection 30 mg Start: 08/18/21 2200 ? ?Code Status: Full code ?Family Communication: Updated patient's friend at bedside ?Level of care:  Telemetry ?Status is: Observation ?The patient will require care spanning > 2 midnights and should be moved to inpatient because: AKI requiring IV fluid and VQ scan to rule out PE given elevated D-dimer and lower extremity DVT ? ? ?Final disposition: Home once medically cleared ? ?Consultants:  ?None ? ?Sch Meds:  ?Scheduled Meds: ? enoxaparin (LOVENOX) injection  30 mg Subcutaneous Q24H  ? ?Continuous Infusions: ? sodium chloride    ? ?PRN Meds:.acetaminophen **OR** acetaminophen, ondansetron **OR** ondansetron (ZOFRAN) IV, senna-docusate ? ?Antimicrobials: ?Anti-infectives (From admission, onward)  ? ? Start     Dose/Rate Route Frequency Ordered Stop  ? 08/19/21 1000  doxycycline (VIBRA-TABS) tablet 100 mg  Status:  Discontinued       ?See Hyperspace for full Linked Orders Report.  ? 100 mg Oral Every 12 hours 08/18/21 1937 08/19/21 0848  ? 08/18/21 2030  doxycycline (VIBRA-TABS) tablet 200 mg       ?See Hyperspace for full Linked Orders Report.  ? 200 mg Oral  Once 08/18/21 1937 08/18/21 2051  ? ?  ? ? ? ?I have personally reviewed the following labs and images: ?CBC: ?Recent Labs  ?Lab 08/18/21 ?1458 08/19/21 ?0439  ?WBC 6.7 5.6  ?NEUTROABS 5.3  --   ?HGB 10.9* 10.4*  ?HCT 32.8* 30.2*  ?MCV 92.1 92.9  ?PLT 237 213  ? ?BMP &GFR ?Recent Labs  ?Lab 08/18/21 ?1435 08/19/21 ?0439  ?NA 133* 139  ?K 4.7 4.2  ?CL 100 107  ?CO2 23 24  ?GLUCOSE 125* 88  ?BUN 36* 30*  ?CREATININE 2.31* 2.14*  ?CALCIUM 9.5 8.6*  ? ?Estimated Creatinine Clearance: 26.6 mL/min (A) (by C-G formula based on SCr of 2.14 mg/dL (H)). ?Liver & Pancreas: ?Recent Labs  ?Lab 08/18/21 ?1435 08/19/21 ?0439  ?AST 18 15  ?ALT 9 11  ?ALKPHOS 109 110  ?BILITOT 0.5 0.9  ?PROT 6.7 6.0*  ?ALBUMIN 3.4* 2.9*  ? ?No results for input(s): LIPASE, AMYLASE in the last 168 hours. ?No results for input(s): AMMONIA in the last 168 hours. ?Diabetic: ?No results for input(s): HGBA1C in the last 72 hours. ?No results for input(s): GLUCAP in the last 168 hours. ?Cardiac  Enzymes: ?No results for input(s): CKTOTAL, CKMB, CKMBINDEX, TROPONINI in the last 168 hours. ?No results for input(s): PROBNP in the last 8760 hours. ?Coagulation Profile: ?No results for input(s): INR, PROTIME in the last 168 hours. ?Thyroid Function Tests: ?No results for input(s): TSH, T4TOTAL, FREET4, T3FREE, THYROIDAB in the last 72 hours. ?Lipid Profile: ?No results for input(s): CHOL, HDL, LDLCALC, TRIG, CHOLHDL, LDLDIRECT in the last 72 hours. ?Anemia Panel: ?No results for input(s): VITAMINB12, FOLATE, FERRITIN, TIBC, IRON, RETICCTPCT in the last 72 hours. ?Urine analysis: ?   ?Component Value Date/Time  ? COLORURINE YELLOW 08/18/2021 1555  ? APPEARANCEUR CLEAR 08/18/2021 1555  ? LABSPEC 1.008 08/18/2021 1555  ? PHURINE 7.5 08/18/2021 1555  ? GLUCOSEU NEGATIVE 08/18/2021 1555  ? HGBUR LARGE (A) 08/18/2021 1555  ? BILIRUBINUR NEGATIVE 08/18/2021 1555  ?  Benjamin Stain NEGATIVE 08/18/2021 1555  ? PROTEINUR 30 (A) 08/18/2021 1555  ? NITRITE NEGATIVE 08/18/2021 1555  ? LEUKOCYTESUR TRACE (A) 08/18/2021 1555  ? ?Sepsis Labs: ?Invalid input(s): PROCALCITONIN, LACTICIDVEN ? ?Microbiology: ?No results found for this or any previous visit (from the past 240 hour(s)). ? ?Radiology Studies: ?DG Chest 2 View ? ?Result Date: 08/18/2021 ?CLINICAL DATA:  Fatigue, concern for infection EXAM: CHEST - 2 VIEW COMPARISON:  None. FINDINGS: The heart size and mediastinal contours are within normal limits. Both lungs are clear. The visualized skeletal structures are unremarkable. IMPRESSION: No active cardiopulmonary disease. Electronically Signed   By: Jerilynn Mages.  Shick M.D.   On: 08/18/2021 14:58  ? ?US RENAL ? ?Result Date: 08/18/2021 ?CLINICAL DATA:  Acute kidney injury EXAM: RENAL / URINARY TRACT ULTRASOUND COMPLETE COMPARISON:  None. FINDINGS: Right Kidney: Renal measurements: 12.6 x 4.7 x 6.0 cm = volume: 185 mL. Echogenic renal parenchyma. No mass or hydronephrosis. Left Kidney: Renal measurements: 12.1 x 5.5 x 5.7 cm = volume: 198 mL.  Echogenic renal parenchyma. No mass or hydronephrosis. Bladder: Appears normal for degree of bladder distention. Other: None. IMPRESSION: Echogenic renal parenchyma, suggesting medical renal disease. No hydronep

## 2021-08-19 NOTE — Plan of Care (Signed)
?  Problem: Clinical Measurements: ?Goal: Will remain free from infection ?Outcome: Progressing ?Goal: Diagnostic test results will improve ?Outcome: Progressing ?Goal: Respiratory complications will improve ?Outcome: Progressing ?  ?

## 2021-08-20 DIAGNOSIS — F4329 Adjustment disorder with other symptoms: Secondary | ICD-10-CM

## 2021-08-20 LAB — C-REACTIVE PROTEIN: CRP: 3.6 mg/dL — ABNORMAL HIGH (ref <1.0)

## 2021-08-20 LAB — CBC
HCT: 30.1 % — ABNORMAL LOW (ref 36.0–46.0)
Hemoglobin: 10.1 g/dL — ABNORMAL LOW (ref 12.0–15.0)
MCH: 31.5 pg (ref 26.0–34.0)
MCHC: 33.6 g/dL (ref 30.0–36.0)
MCV: 93.8 fL (ref 80.0–100.0)
Platelets: 211 10*3/uL (ref 150–400)
RBC: 3.21 MIL/uL — ABNORMAL LOW (ref 3.87–5.11)
RDW: 11.2 % — ABNORMAL LOW (ref 11.5–15.5)
WBC: 4.6 10*3/uL (ref 4.0–10.5)
nRBC: 0 % (ref 0.0–0.2)

## 2021-08-20 LAB — RENAL FUNCTION PANEL
Albumin: 2.7 g/dL — ABNORMAL LOW (ref 3.5–5.0)
Anion gap: 8 (ref 5–15)
BUN: 30 mg/dL — ABNORMAL HIGH (ref 6–20)
CO2: 22 mmol/L (ref 22–32)
Calcium: 8.7 mg/dL — ABNORMAL LOW (ref 8.9–10.3)
Chloride: 111 mmol/L (ref 98–111)
Creatinine, Ser: 2.03 mg/dL — ABNORMAL HIGH (ref 0.44–1.00)
GFR, Estimated: 30 mL/min — ABNORMAL LOW (ref >60)
Glucose, Bld: 76 mg/dL (ref 70–99)
Phosphorus: 4.6 mg/dL (ref 2.5–4.6)
Potassium: 4 mmol/L (ref 3.5–5.1)
Sodium: 141 mmol/L (ref 135–145)

## 2021-08-20 LAB — MAGNESIUM: Magnesium: 1.7 mg/dL (ref 1.7–2.4)

## 2021-08-20 LAB — HEPATITIS B SURFACE ANTIGEN: Hepatitis B Surface Ag: NONREACTIVE

## 2021-08-20 LAB — IRON AND TIBC
Iron: 45 ug/dL (ref 28–170)
Saturation Ratios: 29 % (ref 10.4–31.8)
TIBC: 156 ug/dL — ABNORMAL LOW (ref 250–450)
UIBC: 111 ug/dL

## 2021-08-20 LAB — RETICULOCYTES
Immature Retic Fract: 13.2 % (ref 2.3–15.9)
RBC.: 3.16 MIL/uL — ABNORMAL LOW (ref 3.87–5.11)
Retic Count, Absolute: 36 10*3/uL (ref 19.0–186.0)
Retic Ct Pct: 1.1 % (ref 0.4–3.1)

## 2021-08-20 LAB — FERRITIN: Ferritin: 221 ng/mL (ref 11–307)

## 2021-08-20 LAB — VITAMIN B12: Vitamin B-12: 828 pg/mL (ref 180–914)

## 2021-08-20 LAB — SEDIMENTATION RATE: Sed Rate: 50 mm/hr — ABNORMAL HIGH (ref 0–22)

## 2021-08-20 LAB — HIV ANTIBODY (ROUTINE TESTING W REFLEX): HIV Screen 4th Generation wRfx: NONREACTIVE

## 2021-08-20 LAB — FOLATE: Folate: 14.7 ng/mL (ref >5.9)

## 2021-08-20 MED ORDER — RIVAROXABAN 10 MG PO TABS: 10.0000 mg | ORAL_TABLET | Freq: Every day | ORAL | 1 refills | Status: AC

## 2021-08-20 NOTE — Progress Notes (Signed)
?PROGRESS NOTE ? ?Lynn Mcdonald SPQ:[id number] DOB: 1971-03-17  ? ?PCP: Lazoff, Shawn P, DO ? ?Patient is from: Home. ? ?DOA: 08/18/2021 LOS: 1 ? ?Chief complaints ?Chief Complaint  ?Patient presents with  ? Fatigue  ?  ? ?Brief Narrative / Interim history: ?51 year old F with PMH of childhood asthma and seasonal allergies directed to ED by PCP due to AKI.  Creatinine 2.6 at PCP office.  Her creatinine of 0.72 in 11/2019.  Suspect some element of CKD based on renal US.  Recently treated for possible sinusitis and viral URI with doxycycline and then with Augmentin.  There is also concern about tick bite disease.  She had elevated inflammatory markers.  Seems Lyme and RMSF serologies were ordered by PCP.  ? ?Creatinine improved to 2.0 with IV fluid.  She has large Hgb with 20-50 RBC per hpf on UA.  Nephrology consulted.  ? ?Subjective: ?Seen and examined earlier this morning.  No major events overnight of this morning.  She slept fairly well.  She seems to be overwhelmed and tearful.  She says she lost her husband about a year ago.  She is a single mom.  She reports a lot of stress at work and at home.  Also very concerned about her also had a kidney.  She has not talked to anyone about this before.  She denies history of anxiety or depression but feels down.  She denies suicidal homicidal ideation. ? ?Objective: ?Vitals:  ? 08/19/21 0407 08/19/21 1332 08/19/21 1937 08/20/21 0327  ?BP: 112/63 131/62 116/66 110/90  ?Pulse: 87 88 92 83  ?Resp: 20 17 18 20   ?Temp: 98.5 ?F (36.9 ?C) 98.7 ?F (37.1 ?C) 98.7 ?F (37.1 ?C) 98.2 ?F (36.8 ?C)  ?TempSrc: Oral Oral Oral Oral  ?SpO2: 96% 97% 100% 97%  ?Weight:      ?Height:      ? ? ?Examination: ? ?GENERAL: No apparent distress.  Nontoxic. ?HEENT: MMM.  Vision and hearing grossly intact.  ?NECK: Supple.  No apparent JVD.  ?RESP:  No IWOB.  Fair aeration bilaterally. ?CVS:  RRR. Heart sounds normal.  ?ABD/GI/GU: BS+. Abd soft, NTND.  ?MSK/EXT:  Moves extremities. No apparent  deformity. No edema.  ?SKIN: no apparent skin lesion or wound ?NEURO: Awake and alert. Oriented appropriately.  No apparent focal neuro deficit. ?PSYCH: tearful.  Appears overwhelmed. ? ?Procedures:  ?None ? ?Microbiology summarized: ?None ? ?Assessment and Plan: ?* AKI (acute kidney injury) (HCC) ?Recent Labs  ?  08/18/21 ?1435 08/19/21 ?0439 08/20/21 ?0427  ?BUN 36* 30* 30*  ?CREATININE 2.31* 2.14* 2.03*  ?Unknown baseline. Cr 2.64 at PCP office on 4/27, was 0.72 in August 2021.  Not on nephrotoxic meds except for occasional NSAID.  Recently had some constitutional symptoms with subjective fever, chills, arthralgia, hot flash... She also have elevated inflammatory markers.  Seen by PCP recently and diagnosed with URI with viral exanthem and sinusitis for which she was treated with doxycycline and then Augmentin.  UA with large Hgb 20-50 RBCs per hpf.  No proteinuria.  CK within normal.  Renal September 2021 suggested medical renal disease.  ?-Continue IV fluid ?-Discussed with nephrology who will see patient in house ?-She may need autoimmune work-up ? ?Tick-borne illness ?Unlikely.  She found a tick on her person 2 nights prior to admission.  The tick was small and not engorged.  She had constitutional symptoms before tick bite.  Some of her symptoms seems to be menopausal.  She also have age-indeterminate distal DVT.  ?-  Follow serologies for Lyme and RMSF from PCP office ?-Discontinued doxycycline ?-Consider autoimmune work-up or trial of steroid ? ?Stress and adjustment reaction ?Patient lost her husband unexpectedly about a year ago.  She is a single mom.  She reports a lot of stress at home and work.  She was advised to see a therapist by a friend.  She has not discussed this with PCP yet.  She also has not seen a therapist or psychiatrist.  She feels down but does not feel like hurting yourself or someone else.  She also seems to be undergoing menopausal change. ?-I have encouraged her to discuss this issue with PCP ?-I  think she would benefit from therapy and SSRI which could also help with some of a menopausal symptoms. ? ?Normocytic anemia ?Recent Labs  ?  08/18/21 ?1458 08/19/21 ?0439 08/20/21 ?0427  ?HGB 10.9* 10.4* 10.1*  ?Check anemia panel in the morning ? ? ?Deep vein thrombosis (DVT) of distal vein of left lower extremity (HCC) ?See elevated D-dimer for more. ?Follow-up VQ scan ? ?Elevated d-dimer ?No calf tenderness or respiratory symptoms prior to admission.  However, she felt left-sided chest pain with deep breathing last night.  No prior history of blood clot.  Not on HRT.  No history of cancer.  No recent surgery, long travel or immobilization.  LE Doppler  with age indeterminate deep vein thrombosis  ?involving the left peroneal veins.  VQ scan negative but cannot exclude small peripheral PEs. ?-We have discussed about Doppler finding.  Although this does not warrant treatment, her risk of VTE is higher than normal in the future, and VTE prophylaxis with low-dose Xarelto would be reasonable. ?-I have also recommended age-appropriate cancer screenings such as mammogram, Pap smear and colonoscopy. ? ? ? ? ?DVT prophylaxis:  ?enoxaparin (LOVENOX) injection 30 mg Start: 08/18/21 2200 ? ?Code Status: Full code ?Family Communication: None at bedside. ?Level of care: Med-Surg ?Status is: Inpatient ?The patient will remain inpatient because: AKI requiring IV fluids and further evaluation by nephrology ? ?Final disposition: Home once medically cleared ? ?Consultants:  ?Nephrology ? ?Sch Meds:  ?Scheduled Meds: ? enoxaparin (LOVENOX) injection  30 mg Subcutaneous Q24H  ? technetium albumin aggregated  4.2 millicurie Intravenous Once  ? ?Continuous Infusions: ? sodium chloride 100 mL/hr at 08/20/21 0056  ? ?PRN Meds:.acetaminophen **OR** acetaminophen, ondansetron **OR** ondansetron (ZOFRAN) IV, senna-docusate ? ?Antimicrobials: ?Anti-infectives (From admission, onward)  ? ? Start     Dose/Rate Route Frequency Ordered Stop  ?  08/19/21 1000  doxycycline (VIBRA-TABS) tablet 100 mg  Status:  Discontinued       ?See Hyperspace for full Linked Orders Report.  ? 100 mg Oral Every 12 hours 08/18/21 1937 08/19/21 0848  ? 08/18/21 2030  doxycycline (VIBRA-TABS) tablet 200 mg       ?See Hyperspace for full Linked Orders Report.  ? 200 mg Oral  Once 08/18/21 1937 08/18/21 2051  ? ?  ? ? ? ?I have personally reviewed the following labs and images: ?CBC: ?Recent Labs  ?Lab 08/18/21 ?1458 08/19/21 ?0439 08/20/21 ?0427  ?WBC 6.7 5.6 4.6  ?NEUTROABS 5.3  --   --   ?HGB 10.9* 10.4* 10.1*  ?HCT 32.8* 30.2* 30.1*  ?MCV 92.1 92.9 93.8  ?PLT 237 213 211  ? ?BMP &GFR ?Recent Labs  ?Lab 08/18/21 ?1435 08/19/21 ?0439 08/20/21 ?0427  ?NA 133* 139 141  ?K 4.7 4.2 4.0  ?CL 100 107 111  ?CO2 23 24 22   ?GLUCOSE 125* 88  76  ?BUN 36* 30* 30*  ?CREATININE 2.31* 2.14* 2.03*  ?CALCIUM 9.5 8.6* 8.7*  ?MG  --   --  1.7  ?PHOS  --   --  4.6  ? ?Estimated Creatinine Clearance: 28 mL/min (A) (by C-G formula based on SCr of 2.03 mg/dL (H)). ?Liver & Pancreas: ?Recent Labs  ?Lab 08/18/21 ?1435 08/19/21 ?0439 08/20/21 ?0427  ?AST 18 15  --   ?ALT 9 11  --   ?ALKPHOS 109 110  --   ?BILITOT 0.5 0.9  --   ?PROT 6.7 6.0*  --   ?ALBUMIN 3.4* 2.9* 2.7*  ? ?No results for input(s): LIPASE, AMYLASE in the last 168 hours. ?No results for input(s): AMMONIA in the last 168 hours. ?Diabetic: ?No results for input(s): HGBA1C in the last 72 hours. ?No results for input(s): GLUCAP in the last 168 hours. ?Cardiac Enzymes: ?Recent Labs  ?Lab 08/19/21 ?0439  ?CKTOTAL 75  ? ?No results for input(s): PROBNP in the last 8760 hours. ?Coagulation Profile: ?No results for input(s): INR, PROTIME in the last 168 hours. ?Thyroid Function Tests: ?No results for input(s): TSH, T4TOTAL, FREET4, T3FREE, THYROIDAB in the last 72 hours. ?Lipid Profile: ?No results for input(s): CHOL, HDL, LDLCALC, TRIG, CHOLHDL, LDLDIRECT in the last 72 hours. ?Anemia Panel: ?Recent Labs  ?  08/20/21 ?0427  ?VITAMINB12 828   ?FOLATE 14.7  ?FERRITIN 221  ?TIBC 156*  ?IRON 45  ?RETICCTPCT 1.1  ? ?Urine analysis: ?   ?Component Value Date/Time  ? COLORURINE YELLOW 08/18/2021 1555  ? APPEARANCEUR CLEAR 08/18/2021 1555  ? LABSPEC 1.008

## 2021-08-20 NOTE — Assessment & Plan Note (Addendum)
No calf tenderness or respiratory symptoms prior to admission.  However, she felt left-sided chest pain with deep breathing last night.  No prior history of blood clot.  Not on HRT.  No history of cancer.  No recent surgery, long travel or immobilization.  LE Doppler  with age indeterminate deep vein thrombosis  ?involving the left peroneal veins.  VQ scan negative but cannot exclude small peripheral PEs. We have discussed about Doppler finding.  Although this does not warrant treatment, her risk of VTE is higher than normal in the future, and VTE prophylaxis with low-dose Xarelto would be reasonable. I have also recommended age-appropriate cancer screenings such as mammogram, Pap smear and colonoscopy. ?

## 2021-08-20 NOTE — Assessment & Plan Note (Addendum)
Patient lost her husband unexpectedly about a year ago.  She is a single mom.  She reports a lot of stress at home and work.  She was advised to see a therapist by a friend.  She has not discussed this with PCP yet.  She also has not seen a therapist or psychiatrist.  She feels down but does not feel like hurting yourself or someone else.  She also seems to be undergoing menopausal change. ?-I have encouraged her to discuss this issue with PCP ?-I think she would benefit from therapy and SSRI which could also help with some of a menopausal symptoms. ?

## 2021-08-20 NOTE — Progress Notes (Signed)
AVS given to patient and explained at the bedside. Medications and follow up appointments have been explained with pt verbalizing understanding.  

## 2021-08-20 NOTE — Discharge Summary (Signed)
? ?Physician Discharge Summary  ?Lynn Mcdonald IWL:[id number] DOB: [birth year] DOA: 08/18/2021 ? ?PCP: Lazoff, Shawn P, DO ? ?Admit date: 08/18/2021 ?Discharge date: 08/20/2021 ?Admitted From: Home ?Disposition: Home ?Recommendations for Outpatient Follow-up:  ?Follow up with PCP in 1 to 2 weeks ?Recheck renal function and CBC at follow-up ?Recommend evaluation for depression/stress related adjustment disorder. ?Nephrology to arrange outpatient follow-up in 2 weeks ?Please follow up on the following pending results: Inflammatory and autoimmune labs as part of evaluation for renal failure  ? ?Home Health: Not indicated ?Equipment/Devices: Not indicated ? ?Discharge Condition: Stable  ?CODE STATUS: Full code ? Follow-up Information   ? ? Lazoff, Shawn P, DO. Schedule an appointment as soon as possible for a visit in 1 week(s).   ?Specialty: Family Medicine ?Contact information: ?[address] ?[place] [address] ?[phone] ? ? ?  ?  ? ?  ?  ? ?  ? ? ?Hospital course ?51 year old F with PMH of childhood asthma and seasonal allergies directed to ED by PCP due to AKI.  Creatinine 2.6 at PCP office.  Her creatinine of 0.72 in 11/2019.  Suspect some element of CKD based on renal US.  Recently treated for possible sinusitis and viral URI with doxycycline and then with Augmentin.  There is also concern about tick bite disease.  She had elevated inflammatory markers.  Seems Lyme and RMSF serologies were ordered by PCP.  ? ?Creatinine improved to 2.0 with IV fluid.  She has large Hgb with 20-50 RBC per hpf on UA.  Nephrology consulted, and ordered autoimmune and inflammatory labs for further evaluation of AKI and possible CKD.  Nephrology to arrange outpatient follow-up in 2 weeks. ? ?Of note, patient reports a lot of stress lately both at work and home.  She says she lost her husband about a year ago.  She is a single mom.  She says she might be depressed but does not have suicidal or homicidal thought.  She says her  friend advised her to seek therapy that she did not follow through.  She also seems to have some menopausal signs.  Obviously, current diagnosis of renal failure and DVT did not help.  She seems overwhelmed.  I have encouraged her to talk to PCP.  I think she would benefit from therapy at minimum.  SSRI could help as well. ?  ? ?See individual problem list below for more on hospital course. ? ?Problems addressed during this hospitalization ?Problem  ?Aki (Acute Kidney Injury) (Hcc)  ?Stress and Adjustment Reaction  ?Age-indeterminate left lower extremity distal DVT  ?Tick-borne illness  ?Normocytic Anemia  ?Elevated D-Dimer  ?  ?Assessment and Plan: ?* AKI (acute kidney injury) (HCC) ?Recent Labs  ?  08/18/21 ?1435 08/19/21 ?0439 08/20/21 ?0427  ?BUN 36* 30* 30*  ?CREATININE 2.31* 2.14* 2.03*  ?Unknown baseline. Cr 2.64 at PCP office on 4/27, was 0.72 in August 2021.  Not on nephrotoxic meds except for occasional NSAID.  Recently had some constitutional symptoms with subjective fever, chills, arthralgia, hot flash... She also have elevated inflammatory markers.  Seen by PCP recently and diagnosed with URI with viral exanthem and sinusitis for which she was treated with doxycycline and then Augmentin.  UA with large Hgb 20-50 RBCs per hpf.  No proteinuria.  CK within normal.  Renal US suggested medical renal disease.  Evaluated by nephrology.  Autoimmune and inflammatory labs ordered.  Nephrology to follow-up on these labs and arrange outpatient follow-up in 2 weeks. ? ?Stress  and adjustment reaction ?Patient lost her husband unexpectedly about a year ago.  She is a single mom.  She reports a lot of stress at home and work.  She was advised to see a therapist by a friend.  She has not discussed this with PCP yet.  She also has not seen a therapist or psychiatrist.  She feels down but does not feel like hurting yourself or someone else.  She also seems to be undergoing menopausal change. ?-I have encouraged her to  discuss this issue with PCP ?-I think she would benefit from therapy and SSRI which could also help with some of a menopausal symptoms. ? ?Age-indeterminate left lower extremity distal DVT ?No calf tenderness or respiratory symptoms prior to admission.  However, she felt left-sided chest pain with deep breathing last night.  No prior history of blood clot.  Not on HRT.  No history of cancer.  No recent surgery, long travel or immobilization.  LE Doppler  with age indeterminate deep vein thrombosis  ?involving the left peroneal veins.  VQ scan negative but cannot exclude small peripheral PEs. We have discussed about Doppler finding.  Although this does not warrant treatment, her risk of VTE is higher than normal in the future, and VTE prophylaxis with low-dose Xarelto would be reasonable. I have also recommended age-appropriate cancer screenings such as mammogram, Pap smear and colonoscopy. ? ?Tick-borne illness ?Unlikely.  She found a tick on her person 2 nights prior to admission.  The tick was small and not engorged.  She had constitutional symptoms before tick bite.  Some of her symptoms seems to be menopausal.  She also have age-indeterminate distal DVT.  ?-Follow serologies for Lyme and RMSF from PCP office ?-Discontinued doxycycline ?-Follow-up autoimmune and inflammatory labs ordered by nephrology ? ?Normocytic anemia ?Recent Labs  ?  08/18/21 ?1458 08/19/21 ?0439 08/20/21 ?0427  ?HGB 10.9* 10.4* 10.1*  ?Check anemia panel in the morning ? ? ? ? ?Vital signs ?Vitals:  ? 08/19/21 1332 08/19/21 1937 08/20/21 0327 08/20/21 1312  ?BP: 131/62 116/66 110/90 129/63  ?Pulse: 88 92 83 91  ?Temp: 98.7 ?F (37.1 ?C) 98.7 ?F (37.1 ?C) 98.2 ?F (36.8 ?C) 99 ?F (37.2 ?C)  ?Resp: 17 18 20 17   ?Height:      ?Weight:      ?SpO2: 97% 100% 97% 99%  ?TempSrc: Oral Oral Oral Oral  ?BMI (Calculated):      ?  ? ?Discharge exam ? ?GENERAL: No apparent distress.  Nontoxic. ?HEENT: MMM.  Vision and hearing grossly intact.  ?NECK:  Supple.  No apparent JVD.  ?RESP:  No IWOB.  Fair aeration bilaterally. ?CVS:  RRR. Heart sounds normal.  ?ABD/GI/GU: BS+. Abd soft, NTND.  ?MSK/EXT:  Moves extremities. No apparent deformity. No edema.  ?SKIN: no apparent skin lesion or wound ?NEURO: Awake and alert. Oriented appropriately.  No apparent focal neuro deficit. ?PSYCH: Looks overwhelmed and tearful ? ?Discharge Instructions ?Discharge Instructions   ? ? Diet - low sodium heart healthy   Complete by: As directed ?  ? Discharge instructions   Complete by: As directed ?  ? It has been a pleasure taking care of you! ? ?You were hospitalized due to acute kidney injury.  Your kidney function has improved but not back to normal.  We recommend good hydration moving forward.  Avoid over-the-counter pain medication other than plain Tylenol. ?Follow-up with your primary care doctor in 1 to 2 weeks or sooner if needed.  Nephrology will follow  up on blood test tests and arrange outpatient follow-up in about 2 weeks. ? ?The blood clot in your left leg is not new, and does not need treatment.  However, you have higher risk of having another blood clot in the future. So we have started you on low-dose of blood thinner to reduce your risk.  Make sure you have age-appropriate cancer screenings such as mammogram, Pap smear and colonoscopy.  ? ? ?Take care,  ? Increase activity slowly   Complete by: As directed ?  ? ?  ? ?Allergies as of 08/20/2021   ? ?   Reactions  ? Eggs Or Egg-derived Products Itching  ? tongue  ? Sulfa Antibiotics Itching  ? tongue  ? ?  ? ?  ?Medication List  ?  ? ?STOP taking these medications   ? ?amoxicillin-clavulanate 875-125 MG tablet ?Commonly known as: AUGMENTIN ?  ?naproxen sodium 220 MG tablet ?Commonly known as: ALEVE ?  ?promethazine-dextromethorphan 6.25-15 MG/5ML syrup ?Commonly known as: PROMETHAZINE-DM ?  ? ?  ? ?TAKE these medications   ? ?fluticasone 50 MCG/ACT nasal spray ?Commonly known as: Flonase Allergy Relief ?Place 2 sprays  into both nostrils daily as needed for allergies or rhinitis. ?  ?rivaroxaban 10 MG Tabs tablet ?Commonly known as: XARELTO ?Take 1 tablet (10 mg total) by mouth daily. ?  ? ?  ? ? ?Consultations: ?Nephrology ? ?P

## 2021-08-20 NOTE — Consult Note (Signed)
Lynn Mcdonald  ?INPATIENT CONSULTATION ? ?Reason for Consultation: AKI ?Requesting Provider: Dr. Cyndia Skeeters ? ?HPI: Lynn Mcdonald is an 51 y.o. female with no significant PMH who is seen for evaluation and management of AKI and hematuria.  ? ?Developed presumed viral illness about 1 month ago - 84yo son had mild illness, she developed viral URI (cough, dyspnea), viral exanthem (shows me pic today that appears consistent; says was legs, chest, arms), apthous ulcers, malaise. Continued with symptoms, low grade fever at night and saw PCP 4/12 who rx'd doxycycline.  4/21 d/w PCP persistent symptoms - Augmentin x 1 wk Rxd.  Saw PCP 4/27 - tick bite discovered day before, ongoing malaise, migratory polyarthralgias, low grade fevers; monospot neg.  Labs checked which showed Cr 2.6 (from 12/14/19 0.7), ESR 37, CRP 17 and she was directed to ED.   ? ?She was admitted to [facility] and has hydration with Cr 2.3 > 2.0 today.  Electrolytes normal.  UA with 3+ blood 1+ protein, 21-50 RBC/hpf.  Renal US normal size kidneys but echogenic.  Hb 10s, iron replete with normal MCV.  ? ?She notes the above history and adds dark urine for the past few weeks too.  No other rashes, no eye issues, no hemoptysis or epistaxis.  Migratory joint pains - wrist, knee, ankle, some dec grip.  Was taking 1 aleve every 2-3 days since sick.  ? ?No fam hx CKD/ESRD/autoimmune dz.  ?Works for [employer] in [employer], research (life sciences) at times.  ?39yo son at home, lost husband unexpectedly 1 year ago and anxious to get home today.  ? ?PMH: ?Past Medical History:  ?Diagnosis Date  ? Asthma   ? around 51 years old, and nothing since.  ? ?PSH: ?History reviewed. No pertinent surgical history. ? ? ?Past Medical History:  ?Diagnosis Date  ? Asthma   ? around 51 years old, and nothing since.  ? ? ?Medications: ? I have reviewed the patient's current medications. ? ?Medications Prior to Admission  ?Medication Sig Dispense Refill  ? amoxicillin-clavulanate (AUGMENTIN)  875-125 MG tablet Take 1 tablet by mouth 2 (two) times daily.    ? naproxen sodium (ALEVE) 220 MG tablet Take 220 mg by mouth daily as needed (pain).    ? promethazine-dextromethorphan (PROMETHAZINE-DM) 6.25-15 MG/5ML syrup Take 5 mLs by mouth every 6 (six) hours as needed for cough.    ? fluticasone (FLONASE ALLERGY RELIEF) 50 MCG/ACT nasal spray Place 2 sprays into both nostrils daily as needed for allergies or rhinitis. (Patient not taking: Reported on 08/18/2021) 16 g 5  ? ? ?ALLERGIES: ?  ?Allergies  ?Allergen Reactions  ? Eggs Or Egg-Derived Products Itching  ?  tongue  ? Sulfa Antibiotics Itching  ?  tongue  ? ? ?FAM HX: ?Family History  ?Problem Relation Age of Onset  ? Allergic rhinitis Neg Hx   ? Angioedema Neg Hx   ? Asthma Neg Hx   ? Eczema Neg Hx   ? Immunodeficiency Neg Hx   ? Urticaria Neg Hx   ? ? ?Social History:  ? reports that she quit smoking about 26 years ago. She has never used smokeless tobacco. No history on file for alcohol use and drug use. ? ?ROS: 12 system ROS per HPI above  ? ?Blood pressure 129/63, pulse 91, temperature 99 ?F (37.2 ?C), temperature source Oral, resp. rate 17, height 5' 3"  (1.6 m), weight 60.3 kg, SpO2 99 %. ?PHYSICAL EXAM: ?Gen: tired appearing but nontoxic  ?Eyes: not injected,  EOMI ?ENT: MMM, no oral lesions or thrush ?Neck: supple ?CV: tachycardic regular, no rub ?Abd: soft, nontender ?Lungs: clear to bases ?GU: no foley ?Extr:  1+ ankle edema; L wrist TTP and slightly swollen. L MCPs spongy, PIPs and DIPs feel normal ?Neuro: nonfocal ?Skin: no rashes ?  ?Results for orders placed or performed during the hospital encounter of 08/18/21 (from the past 48 hour(s))  ?CBC with Differential/Platelet     Status: Abnormal  ? Collection Time: 08/18/21  2:58 PM  ?Result Value Ref Range  ? WBC 6.7 4.0 - 10.5 K/uL  ? RBC 3.56 (L) 3.87 - 5.11 MIL/uL  ? Hemoglobin 10.9 (L) 12.0 - 15.0 g/dL  ? HCT 32.8 (L) 36.0 - 46.0 %  ? MCV 92.1 80.0 - 100.0 fL  ? MCH 30.6 26.0 - 34.0 pg  ? MCHC  33.2 30.0 - 36.0 g/dL  ? RDW 11.4 (L) 11.5 - 15.5 %  ? Platelets 237 150 - 400 K/uL  ? nRBC 0.0 0.0 - 0.2 %  ? Neutrophils Relative % 81 %  ? Neutro Abs 5.3 1.7 - 7.7 K/uL  ? Lymphocytes Relative 11 %  ? Lymphs Abs 0.8 0.7 - 4.0 K/uL  ? Monocytes Relative 7 %  ? Monocytes Absolute 0.4 0.1 - 1.0 K/uL  ? Eosinophils Relative 1 %  ? Eosinophils Absolute 0.1 0.0 - 0.5 K/uL  ? Basophils Relative 0 %  ? Basophils Absolute 0.0 0.0 - 0.1 K/uL  ? Immature Granulocytes 0 %  ? Abs Immature Granulocytes 0.03 0.00 - 0.07 K/uL  ?  Comment: Performed at [facility], [address], [place], [place] [address]  ?Sedimentation rate     Status: Abnormal  ? Collection Time: 08/18/21  2:58 PM  ?Result Value Ref Range  ? Sed Rate 40 (H) 0 - 22 mm/hr  ?  Comment: Performed at [facility], [address], [place], [place] [address]  ?Urinalysis, Routine w reflex microscopic Urine, Clean Catch     Status: Abnormal  ? Collection Time: 08/18/21  3:55 PM  ?Result Value Ref Range  ? Color, Urine YELLOW YELLOW  ? APPearance CLEAR CLEAR  ? Specific Gravity, Urine 1.008 1.005 - 1.030  ? pH 7.5 5.0 - 8.0  ? Glucose, UA NEGATIVE NEGATIVE mg/dL  ? Hgb urine dipstick LARGE (A) NEGATIVE  ? Bilirubin Urine NEGATIVE NEGATIVE  ? Ketones, ur NEGATIVE NEGATIVE mg/dL  ? Protein, ur 30 (A) NEGATIVE mg/dL  ? Nitrite NEGATIVE NEGATIVE  ? Leukocytes,Ua TRACE (A) NEGATIVE  ? RBC / HPF 21-50 0 - 5 RBC/hpf  ? WBC, UA 0-5 0 - 5 WBC/hpf  ? Squamous Epithelial / LPF 0-5 0 - 5  ?  Comment: Performed at [facility], [address] [place], [place] [address]  ?Pregnancy, urine     Status: None  ? Collection Time: 08/18/21  3:55 PM  ?Result Value Ref Range  ? Preg Test, Ur NEGATIVE NEGATIVE  ?  Comment:        ?THE SENSITIVITY OF THIS ?METHODOLOGY IS >20 mIU/mL. ?Performed at [facility], [address], [place], [place] [address] ?  ?Sodium, urine, random     Status: None  ? Collection  Time: 08/19/21  1:47 AM  ?Result Value Ref Range  ? Sodium, Ur 47 mmol/L  ?  Comment: Performed at [facility], [place] [address], [place], [place] [address]  ?Creatinine, urine, random     Status: None  ? Collection  Time: 08/19/21  1:47 AM  ?Result Value Ref Range  ? Creatinine, Urine 43.57 mg/dL  ?  Comment: Performed at [facility], [place] [address], [place], [place] [address]  ?HIV Antibody (routine testing w rflx)     Status: None  ? Collection Time: 08/19/21  4:39 AM  ?Result Value Ref Range  ? HIV Screen 4th Generation wRfx Non Reactive Non Reactive  ?  Comment: Performed at [facility], [place] [address], [place], [place] [address]  ?Comprehensive metabolic panel     Status: Abnormal  ? Collection Time: 08/19/21  4:39 AM  ?Result Value Ref Range  ? Sodium 139 135 - 145 mmol/L  ? Potassium 4.2 3.5 - 5.1 mmol/L  ? Chloride 107 98 - 111 mmol/L  ? CO2 24 22 - 32 mmol/L  ? Glucose, Bld 88 70 - 99 mg/dL  ?  Comment: Glucose reference range applies only to samples taken after fasting for at least 8 hours.  ? BUN 30 (H) 6 - 20 mg/dL  ? Creatinine, Ser 2.14 (H) 0.44 - 1.00 mg/dL  ? Calcium 8.6 (L) 8.9 - 10.3 mg/dL  ? Total Protein 6.0 (L) 6.5 - 8.1 g/dL  ? Albumin 2.9 (L) 3.5 - 5.0 g/dL  ? AST 15 15 - 41 U/L  ? ALT 11 0 - 44 U/L  ? Alkaline Phosphatase 110 38 - 126 U/L  ? Total Bilirubin 0.9 0.3 - 1.2 mg/dL  ? GFR, Estimated 28 (L) >60 mL/min  ?  Comment: (NOTE) ?Calculated using the CKD-EPI Creatinine Equation (2021) ?  ? Anion gap 8 5 - 15  ?  Comment: Performed at [facility], [place] [address] [place], [place] [address]  ?CBC     Status: Abnormal  ? Collection Time: 08/19/21  4:39 AM  ?Result Value Ref Range  ? WBC 5.6 4.0 - 10.5 K/uL  ? RBC 3.25 (L) 3.87 - 5.11 MIL/uL  ? Hemoglobin 10.4 (L) 12.0 - 15.0 g/dL  ? HCT 30.2 (L) 36.0 - 46.0 %  ? MCV 92.9 80.0 - 100.0 fL  ? MCH 32.0 26.0 - 34.0 pg  ? MCHC 34.4 30.0 - 36.0 g/dL  ? RDW 11.2 (L) 11.5 - 15.5 %  ?  Platelets 213 150 - 400 K/uL  ? nRBC 0.0 0.0 - 0.2 %  ?  Comment: Performed at [facility], [place] [address] [place], [place] [address]  ?CK     Status: None  ? Collection Time: 08/19/21

## 2021-08-21 LAB — HEPATITIS B SURFACE ANTIBODY,QUALITATIVE: Hep B S Ab: NONREACTIVE

## 2021-08-21 LAB — C3 COMPLEMENT: C3 Complement: 132 mg/dL (ref 82–167)

## 2021-08-21 LAB — ANA W/REFLEX IF POSITIVE: Anti Nuclear Antibody (ANA): NEGATIVE

## 2021-08-21 LAB — ANTI-DNA ANTIBODY, DOUBLE-STRANDED: ds DNA Ab: 4 IU/mL (ref 0–9)

## 2021-08-21 LAB — GLOMERULAR BASEMENT MEMBRANE ANTIBODIES: GBM Ab: <0.2 units (ref 0.0–0.9)

## 2021-08-21 LAB — C4 COMPLEMENT: Complement C4, Body Fluid: 28 mg/dL (ref 12–38)

## 2021-08-21 LAB — ANTISTREPTOLYSIN O TITER: ASO: 55 IU/mL (ref 0.0–200.0)

## 2021-08-21 LAB — CYCLIC CITRUL PEPTIDE ANTIBODY, IGG/IGA: CCP Antibodies IgG/IgA: 4 units (ref 0–19)

## 2021-08-21 LAB — RHEUMATOID FACTOR: Rheumatoid fact SerPl-aCnc: 11.2 IU/mL (ref <14.0)

## 2021-08-21 LAB — HEPATITIS C ANTIBODY: HCV Ab: NONREACTIVE

## 2021-08-22 LAB — PROTEIN ELECTROPHORESIS, SERUM
A/G Ratio: 1 (ref 0.7–1.7)
Albumin ELP: 3.2 g/dL (ref 2.9–4.4)
Alpha-1-Globulin: 0.3 g/dL (ref 0.0–0.4)
Alpha-2-Globulin: 0.8 g/dL (ref 0.4–1.0)
Beta Globulin: 0.7 g/dL (ref 0.7–1.3)
Gamma Globulin: 1.3 g/dL (ref 0.4–1.8)
Globulin, Total: 3.1 g/dL (ref 2.2–3.9)
Total Protein ELP: 6.3 g/dL (ref 6.0–8.5)

## 2021-08-22 LAB — KAPPA/LAMBDA LIGHT CHAINS
Kappa free light chain: 49.8 mg/L — ABNORMAL HIGH (ref 3.3–19.4)
Kappa, lambda light chain ratio: 1.2 (ref 0.26–1.65)
Lambda free light chains: 41.4 mg/L — ABNORMAL HIGH (ref 5.7–26.3)

## 2021-08-23 LAB — ANCA TITERS
Atypical P-ANCA titer: <1:20 {titer}
C-ANCA: <1:20 {titer}
P-ANCA: 1:320 {titer} — ABNORMAL HIGH

## 2021-08-25 ENCOUNTER — Other Ambulatory Visit (HOSPITAL_COMMUNITY): Payer: Self-pay | Admitting: Internal Medicine

## 2021-08-25 ENCOUNTER — Other Ambulatory Visit: Payer: Self-pay | Admitting: Internal Medicine

## 2021-08-25 DIAGNOSIS — N179 Acute kidney failure, unspecified: Secondary | ICD-10-CM

## 2021-08-30 ENCOUNTER — Other Ambulatory Visit (HOSPITAL_COMMUNITY): Payer: Self-pay | Admitting: Physician Assistant

## 2021-08-30 DIAGNOSIS — N179 Acute kidney failure, unspecified: Secondary | ICD-10-CM

## 2021-08-31 ENCOUNTER — Other Ambulatory Visit: Payer: Self-pay | Admitting: Radiology

## 2021-08-31 DIAGNOSIS — N179 Acute kidney failure, unspecified: Secondary | ICD-10-CM

## 2021-09-01 ENCOUNTER — Encounter (HOSPITAL_COMMUNITY): Payer: Self-pay

## 2021-09-01 ENCOUNTER — Ambulatory Visit (HOSPITAL_COMMUNITY)
Admission: RE | Admit: 2021-09-01 | Discharge: 2021-09-01 | Disposition: A | Discharge status: Home / Self Care | Payer: 59 | Source: Ambulatory Visit | Attending: Internal Medicine | Admitting: Internal Medicine

## 2021-09-01 DIAGNOSIS — N179 Acute kidney failure, unspecified: Secondary | ICD-10-CM | POA: Insufficient documentation

## 2021-09-01 LAB — CBC
HCT: 30.4 % — ABNORMAL LOW (ref 36.0–46.0)
Hemoglobin: 10.4 g/dL — ABNORMAL LOW (ref 12.0–15.0)
MCH: 31.2 pg (ref 26.0–34.0)
MCHC: 34.2 g/dL (ref 30.0–36.0)
MCV: 91.3 fL (ref 80.0–100.0)
Platelets: 377 10*3/uL (ref 150–400)
RBC: 3.33 MIL/uL — ABNORMAL LOW (ref 3.87–5.11)
RDW: 11.9 % (ref 11.5–15.5)
WBC: 6.4 10*3/uL (ref 4.0–10.5)
nRBC: 0 % (ref 0.0–0.2)

## 2021-09-01 LAB — PROTIME-INR
INR: 1.1 (ref 0.8–1.2)
Prothrombin Time: 13.8 seconds (ref 11.4–15.2)

## 2021-09-01 MED ORDER — MIDAZOLAM HCL 2 MG/2ML IJ SOLN
INTRAMUSCULAR | Status: DC | PRN
  Administered 2021-09-01 (×5): 1 mg via INTRAVENOUS

## 2021-09-01 MED ORDER — LIDOCAINE HCL (PF) 1 % IJ SOLN
INTRAMUSCULAR | Status: AC
  Filled 2021-09-01: qty 30

## 2021-09-01 MED ORDER — GELATIN ABSORBABLE 12-7 MM EX MISC
CUTANEOUS | Status: AC
  Filled 2021-09-01: qty 1

## 2021-09-01 MED ORDER — FENTANYL CITRATE (PF) 100 MCG/2ML IJ SOLN
INTRAMUSCULAR | Status: AC
  Filled 2021-09-01: qty 4

## 2021-09-01 MED ORDER — SODIUM CHLORIDE 0.9 % IV SOLN: INTRAVENOUS | Status: DC

## 2021-09-01 MED ORDER — FENTANYL CITRATE (PF) 100 MCG/2ML IJ SOLN
INTRAMUSCULAR | Status: DC | PRN
Start: 2021-09-01 — End: 2021-09-02
  Administered 2021-09-01 (×3): 50 ug via INTRAVENOUS

## 2021-09-01 MED ORDER — MIDAZOLAM HCL 2 MG/2ML IJ SOLN
INTRAMUSCULAR | Status: AC
  Filled 2021-09-01: qty 6

## 2021-09-01 NOTE — H&P (Signed)
? ?Chief Complaint: ?Patient was seen in consultation today for No chief complaint on file. ? at the request of Estill Bakes A ? ?Referring Physician(s): ?Estill Bakes A ? ?Supervising Physician: Malachy Moan ? ?Patient Status: [facility] - Out-pt ? ?History of Present Illness: ?Lynn Mcdonald is a 51 y.o. female in general good health.  She reports 2 months of "not feeling well" and says this has been associated with headaches, fatigue, joint pain, and dark colored urine.  She had a recent admission with AKI, creatinine up to 2.6.  She reports being worked up for an auto-immune condition and understands this to be the reason for the requested renal biopsy.   ? ?Past Medical History:  ?Diagnosis Date  ? Asthma   ? around 51 years old, and nothing since.  ? ? ?History reviewed. No pertinent surgical history. ? ?Allergies: ?Eggs or egg-derived products and Sulfa antibiotics ? ?Medications: ?Prior to Admission medications   ?Medication Sig Start Date End Date Taking? Authorizing Provider  ?rivaroxaban (XARELTO) 10 MG TABS tablet Take 1 tablet (10 mg total) by mouth daily. 08/20/21   Almon Hercules, MD  ?  ? ?Family History  ?Problem Relation Age of Onset  ? Allergic rhinitis Neg Hx   ? Angioedema Neg Hx   ? Asthma Neg Hx   ? Eczema Neg Hx   ? Immunodeficiency Neg Hx   ? Urticaria Neg Hx   ? ? ?Social History  ? ?Socioeconomic History  ? Marital status: Widowed  ?  Spouse name: Not on file  ? Number of children: Not on file  ? Years of education: Not on file  ? Highest education level: Not on file  ?Occupational History  ? Not on file  ?Tobacco Use  ? Smoking status: Former  ?  Types: Cigarettes  ?  Quit date: 01/01/1995  ?  Years since quitting: 26.6  ? Smokeless tobacco: Never  ?Vaping Use  ? Vaping Use: Never used  ?Substance and Sexual Activity  ? Alcohol use: Not Currently  ? Drug use: Never  ? Sexual activity: Not on file  ?Other Topics Concern  ? Not on file  ?Social History Narrative  ? Not on file  ? ?Social  Determinants of Health  ? ?Financial Resource Strain: Not on file  ?Food Insecurity: Not on file  ?Transportation Needs: Not on file  ?Physical Activity: Not on file  ?Stress: Not on file  ?Social Connections: Not on file  ? ? ?Review of Systems  ?Constitutional:  Positive for activity change, fatigue and fever.  ?HENT: Negative.    ?Eyes: Negative.   ?Respiratory: Negative.    ?Cardiovascular: Negative.   ?Gastrointestinal: Negative.   ?Endocrine: Negative.   ?Genitourinary:  Positive for hematuria.  ?Musculoskeletal:  Positive for arthralgias.  ?Skin: Negative.   ?Allergic/Immunologic: Negative.   ?Neurological:  Positive for headaches.  ?Hematological: Negative.   ?Psychiatric/Behavioral: Negative.    ? ?Vital Signs: ?BP (!) 108/52   Pulse 86   Temp 98.6 ?F (37 ?C) (Oral)   Resp 16   Ht 5\' 3"  (1.6 m)   Wt 121 lb (54.9 kg)   LMP 12/22/2020   SpO2 100%   BMI 21.43 kg/m?  ? ?Physical Exam ?Constitutional:   ?   General: She is not in acute distress. ?   Appearance: Normal appearance. She is not ill-appearing.  ?HENT:  ?   Head: Normocephalic and atraumatic.  ?   Mouth/Throat:  ?   Mouth: Mucous membranes are moist.  ?  Pharynx: Oropharynx is clear.  ?Eyes:  ?   Extraocular Movements: Extraocular movements intact.  ?Cardiovascular:  ?   Rate and Rhythm: Normal rate and regular rhythm.  ?Pulmonary:  ?   Effort: Pulmonary effort is normal.  ?   Breath sounds: Normal breath sounds.  ?Abdominal:  ?   General: Abdomen is flat.  ?   Palpations: Abdomen is soft.  ?Skin: ?   General: Skin is warm and dry.  ?Neurological:  ?   General: No focal deficit present.  ?   Mental Status: She is alert and oriented to person, place, and time.  ?Psychiatric:     ?   Mood and Affect: Mood normal.     ?   Behavior: Behavior normal.  ? ? ?Imaging: ?DG Chest 2 View ? ?Result Date: 08/18/2021 ?CLINICAL DATA:  Fatigue, concern for infection EXAM: CHEST - 2 VIEW COMPARISON:  None. FINDINGS: The heart size and mediastinal contours are  within normal limits. Both lungs are clear. The visualized skeletal structures are unremarkable. IMPRESSION: No active cardiopulmonary disease. Electronically Signed   By: Judie PetitM.  Shick M.D.   On: 08/18/2021 14:58  ? ?NM Pulmonary Perfusion ? ?Result Date: 08/19/2021 ?CLINICAL DATA:  Positive D-dimer. Clinical suspicion for pulmonary embolus. EXAM: NUCLEAR MEDICINE PERFUSION LUNG SCAN TECHNIQUE: Perfusion images were obtained in multiple projections after intravenous injection of radiopharmaceutical. Ventilation scans intentionally deferred if perfusion scan and chest x-ray adequate for interpretation during COVID 19 epidemic. RADIOPHARMACEUTICALS:  4.2 mCi Tc-6853m MAA IV COMPARISON:  Chest x-ray 08/18/2021 FINDINGS: Heterogeneously decreased perfusion in the lung apices may be related to emphysema. No peripheral wedge-shaped segmental perfusion defect identified in either lung. IMPRESSION: Imaging appearance meets PISAPED criteria for pulmonary embolism absent. Electronically Signed   By: Kennith CenterEric  Mansell M.D.   On: 08/19/2021 13:14  ? ?US RENAL ? ?Result Date: 08/18/2021 ?CLINICAL DATA:  Acute kidney injury EXAM: RENAL / URINARY TRACT ULTRASOUND COMPLETE COMPARISON:  None. FINDINGS: Right Kidney: Renal measurements: 12.6 x 4.7 x 6.0 cm = volume: 185 mL. Echogenic renal parenchyma. No mass or hydronephrosis. Left Kidney: Renal measurements: 12.1 x 5.5 x 5.7 cm = volume: 198 mL. Echogenic renal parenchyma. No mass or hydronephrosis. Bladder: Appears normal for degree of bladder distention. Other: None. IMPRESSION: Echogenic renal parenchyma, suggesting medical renal disease. No hydronephrosis. Electronically Signed   By: Charline BillsSriyesh  Krishnan M.D.   On: 08/18/2021 20:51  ? ?VAS US LOWER EXTREMITY VENOUS (DVT) ? ?Result Date: 08/20/2021 ? Lower Venous DVT Study Patient Name:  Lynn Mcdonald  Date of Exam:   08/19/2021 Medical Rec #: [id number]       Accession #:    [id number] Date of Birth: 12-Jul-1970      Patient Gender: F Patient  Age:   [age] years [facility] Procedure:      VAS US LOWER EXTREMITY VENOUS (DVT) Referring Phys: Alwyn RenAYE GONFA --------------------------------------------------------------------------------  Indications: Elevated ddimer.  Comparison Study: no prior Performing Technologist: [place] PonderMegan Stricklin RVS  Examination Guidelines: A complete evaluation includes B-mode imaging, spectral Doppler, color Doppler, and power Doppler as needed of all accessible portions of each vessel. Bilateral testing is considered an integral part of a complete examination. Limited examinations for reoccurring indications may be performed as noted. The reflux portion of the exam is performed with the patient in reverse Trendelenburg.  +---------+---------------+---------+-----------+----------+--------------+ RIGHT    CompressibilityPhasicitySpontaneityPropertiesThrombus Aging +---------+---------------+---------+-----------+----------+--------------+ CFV      Full  Yes      Yes                                 +---------+---------------+---------+-----------+----------+--------------+ SFJ      Full                                                        +---------+---------------+---------+-----------+----------+--------------+ FV Prox  Full                                                        +---------+---------------+---------+-----------+----------+--------------+ FV Mid   Full                                                        +---------+---------------+---------+-----------+----------+--------------+ FV DistalFull                                                        +---------+---------------+---------+-----------+----------+--------------+ PFV      Full                                                        +---------+---------------+---------+-----------+----------+--------------+ POP      Full           Yes      Yes                                  +---------+---------------+---------+-----------+----------+--------------+ PTV      Full                                                        +---------+---------------+---------+-----------+----------+--------------+ PERO     Full

## 2021-09-01 NOTE — Procedures (Signed)
Interventional Radiology Procedure Note ? ?Procedure: US guided core biopsy of LEFT kidney ? ?Complications: None immediate ? ?Estimated Blood Loss: None ? ?Recommendations: ?- Bedrest x 4 hrs ?- DC home ? ?Signed, ? ?Sterling Big, MD ? ? ?

## 2021-09-07 ENCOUNTER — Encounter (HOSPITAL_COMMUNITY): Payer: Self-pay

## 2021-09-08 LAB — SURGICAL PATHOLOGY

## 2021-10-11 ENCOUNTER — Other Ambulatory Visit (HOSPITAL_COMMUNITY): Payer: Self-pay | Admitting: *Deleted

## 2021-10-12 ENCOUNTER — Ambulatory Visit (HOSPITAL_COMMUNITY)
Admission: RE | Admit: 2021-10-12 | Discharge: 2021-10-12 | Disposition: A | Discharge status: Home / Self Care | Payer: 59 | Source: Ambulatory Visit | Attending: Internal Medicine | Admitting: Internal Medicine

## 2021-10-12 VITALS — BP 127/62 | HR 97 | Temp 97.3°F | Resp 16 | Wt 132.0 lb

## 2021-10-12 DIAGNOSIS — N179 Acute kidney failure, unspecified: Secondary | ICD-10-CM | POA: Insufficient documentation

## 2021-10-12 MED ORDER — ACETAMINOPHEN 325 MG PO TABS
ORAL_TABLET | ORAL | Status: AC
  Administered 2021-10-12: 650 mg via ORAL
  Filled 2021-10-12: qty 2

## 2021-10-12 MED ORDER — DIPHENHYDRAMINE HCL 50 MG/ML IJ SOLN
INTRAMUSCULAR | Status: AC
Start: 2021-10-12 — End: 2021-10-12
  Administered 2021-10-12: 25 mg via INTRAVENOUS
  Filled 2021-10-12: qty 1

## 2021-10-12 MED ORDER — SODIUM CHLORIDE 0.9 % IV SOLN
1000.0000 mg | INTRAVENOUS | Status: DC
  Administered 2021-10-12: 1000 mg via INTRAVENOUS
  Filled 2021-10-12: qty 100

## 2021-10-12 MED ORDER — METHYLPREDNISOLONE SODIUM SUCC 125 MG IJ SOLR
INTRAMUSCULAR | Status: AC
  Administered 2021-10-12: 125 mg via INTRAVENOUS
  Filled 2021-10-12: qty 2

## 2021-10-12 MED ORDER — METHYLPREDNISOLONE SODIUM SUCC 125 MG IJ SOLR: 125.0000 mg | INTRAMUSCULAR | Status: DC

## 2021-10-12 MED ORDER — DIPHENHYDRAMINE HCL 50 MG/ML IJ SOLN: 25.0000 mg | INTRAMUSCULAR | Status: DC

## 2021-10-12 MED ORDER — ACETAMINOPHEN 325 MG PO TABS: 650.0000 mg | ORAL_TABLET | ORAL | Status: DC

## 2021-10-16 LAB — QUANTIFERON-TB GOLD PLUS: QuantiFERON-TB Gold Plus: NEGATIVE

## 2021-10-16 LAB — QUANTIFERON-TB GOLD PLUS (RQFGPL)
QuantiFERON Mitogen Value: 2.23 IU/mL
QuantiFERON Nil Value: 0 IU/mL
QuantiFERON TB1 Ag Value: 0 IU/mL
QuantiFERON TB2 Ag Value: 0 IU/mL

## 2021-10-26 ENCOUNTER — Ambulatory Visit (HOSPITAL_COMMUNITY)
Admission: RE | Admit: 2021-10-26 | Discharge: 2021-10-26 | Disposition: A | Discharge status: Home / Self Care | Payer: 59 | Source: Ambulatory Visit | Attending: Internal Medicine | Admitting: Internal Medicine

## 2021-10-26 DIAGNOSIS — I7782 Antineutrophilic cytoplasmic antibody (ANCA) vasculitis: Secondary | ICD-10-CM | POA: Insufficient documentation

## 2021-10-26 MED ORDER — ACETAMINOPHEN 325 MG PO TABS
ORAL_TABLET | ORAL | Status: AC
  Filled 2021-10-26: qty 2

## 2021-10-26 MED ORDER — DIPHENHYDRAMINE HCL 25 MG PO CAPS
25.0000 mg | ORAL_CAPSULE | Freq: Once | ORAL | Status: AC
  Administered 2021-10-26: 25 mg via ORAL

## 2021-10-26 MED ORDER — DIPHENHYDRAMINE HCL 50 MG/ML IJ SOLN: 25.0000 mg | INTRAMUSCULAR | Status: DC

## 2021-10-26 MED ORDER — ACETAMINOPHEN 325 MG PO TABS
650.0000 mg | ORAL_TABLET | ORAL | Status: DC
  Administered 2021-10-26: 650 mg via ORAL

## 2021-10-26 MED ORDER — DIPHENHYDRAMINE HCL 25 MG PO CAPS
ORAL_CAPSULE | ORAL | Status: AC
  Filled 2021-10-26: qty 1

## 2021-10-26 MED ORDER — SODIUM CHLORIDE 0.9 % IV SOLN
1000.0000 mg | INTRAVENOUS | Status: DC
  Administered 2021-10-26: 1000 mg via INTRAVENOUS
  Filled 2021-10-26: qty 100

## 2021-10-26 MED ORDER — METHYLPREDNISOLONE SODIUM SUCC 125 MG IJ SOLR
125.0000 mg | INTRAMUSCULAR | Status: DC
  Administered 2021-10-26: 125 mg via INTRAVENOUS

## 2021-10-26 MED ORDER — METHYLPREDNISOLONE SODIUM SUCC 125 MG IJ SOLR
INTRAMUSCULAR | Status: AC
  Filled 2021-10-26: qty 2

## 2022-03-20 ENCOUNTER — Other Ambulatory Visit: Payer: Self-pay | Admitting: Otolaryngology

## 2022-03-20 DIAGNOSIS — H903 Sensorineural hearing loss, bilateral: Secondary | ICD-10-CM

## 2022-03-27 ENCOUNTER — Other Ambulatory Visit (HOSPITAL_COMMUNITY): Payer: Self-pay

## 2022-03-28 ENCOUNTER — Ambulatory Visit (HOSPITAL_COMMUNITY)
Admission: RE | Admit: 2022-03-28 | Discharge: 2022-03-28 | Disposition: A | Discharge status: Home / Self Care | Payer: 59 | Source: Ambulatory Visit | Attending: Internal Medicine | Admitting: Internal Medicine

## 2022-03-28 DIAGNOSIS — I7782 Antineutrophilic cytoplasmic antibody (ANCA) vasculitis: Secondary | ICD-10-CM | POA: Insufficient documentation

## 2022-03-28 MED ORDER — SODIUM CHLORIDE 0.9 % IV SOLN
1000.0000 mg | Freq: Once | INTRAVENOUS | Status: AC
  Administered 2022-03-28: 1000 mg via INTRAVENOUS
  Filled 2022-03-28: qty 100

## 2022-03-28 MED ORDER — METHYLPREDNISOLONE SODIUM SUCC 125 MG IJ SOLR
125.0000 mg | Freq: Once | INTRAMUSCULAR | Status: AC
  Administered 2022-03-28: 125 mg via INTRAVENOUS

## 2022-03-28 MED ORDER — ACETAMINOPHEN 325 MG PO TABS
ORAL_TABLET | ORAL | Status: AC
  Filled 2022-03-28: qty 2

## 2022-03-28 MED ORDER — DIPHENHYDRAMINE HCL 25 MG PO CAPS
ORAL_CAPSULE | ORAL | Status: AC
  Administered 2022-03-28: 25 mg
  Filled 2022-03-28: qty 1

## 2022-03-28 MED ORDER — METHYLPREDNISOLONE SODIUM SUCC 125 MG IJ SOLR
INTRAMUSCULAR | Status: AC
  Filled 2022-03-28: qty 2

## 2022-03-28 MED ORDER — ACETAMINOPHEN 325 MG PO TABS
650.0000 mg | ORAL_TABLET | Freq: Once | ORAL | Status: AC
  Administered 2022-03-28: 650 mg via ORAL

## 2022-03-28 MED ORDER — DIPHENHYDRAMINE HCL 50 MG/ML IJ SOLN: 25.0000 mg | Freq: Once | INTRAMUSCULAR | Status: DC

## 2022-04-12 ENCOUNTER — Ambulatory Visit
Admission: RE | Admit: 2022-04-12 | Discharge: 2022-04-12 | Disposition: A | Discharge status: Home / Self Care | Payer: 59 | Source: Ambulatory Visit | Attending: Otolaryngology | Admitting: Otolaryngology

## 2022-04-12 DIAGNOSIS — H903 Sensorineural hearing loss, bilateral: Secondary | ICD-10-CM

## 2022-04-12 MED ORDER — GADOPICLENOL 0.5 MMOL/ML IV SOLN
6.0000 mL | Freq: Once | INTRAVENOUS | Status: AC | PRN
  Administered 2022-04-12: 6 mL via INTRAVENOUS

## 2022-09-25 ENCOUNTER — Other Ambulatory Visit (HOSPITAL_COMMUNITY): Payer: Self-pay

## 2022-09-27 ENCOUNTER — Ambulatory Visit (HOSPITAL_COMMUNITY)
Admission: RE | Admit: 2022-09-27 | Discharge: 2022-09-27 | Disposition: A | Discharge status: Home / Self Care | Payer: 59 | Source: Ambulatory Visit | Attending: Internal Medicine | Admitting: Internal Medicine

## 2022-09-27 DIAGNOSIS — I7782 Antineutrophilic cytoplasmic antibody (ANCA) vasculitis: Secondary | ICD-10-CM | POA: Diagnosis not present

## 2022-09-27 MED ORDER — DIPHENHYDRAMINE HCL 50 MG/ML IJ SOLN
25.0000 mg | Freq: Once | INTRAMUSCULAR | Status: AC
  Administered 2022-09-27: 25 mg via INTRAVENOUS

## 2022-09-27 MED ORDER — ACETAMINOPHEN 325 MG PO TABS
ORAL_TABLET | ORAL | Status: AC
  Filled 2022-09-27: qty 2

## 2022-09-27 MED ORDER — SODIUM CHLORIDE 0.9 % IV SOLN
1000.0000 mg | Freq: Once | INTRAVENOUS | Status: AC
  Administered 2022-09-27: 1000 mg via INTRAVENOUS
  Filled 2022-09-27: qty 100

## 2022-09-27 MED ORDER — METHYLPREDNISOLONE SODIUM SUCC 125 MG IJ SOLR
125.0000 mg | Freq: Once | INTRAMUSCULAR | Status: AC
  Administered 2022-09-27: 125 mg via INTRAVENOUS

## 2022-09-27 MED ORDER — DIPHENHYDRAMINE HCL 50 MG/ML IJ SOLN
INTRAMUSCULAR | Status: AC
  Filled 2022-09-27: qty 1

## 2022-09-27 MED ORDER — ACETAMINOPHEN 325 MG PO TABS
650.0000 mg | ORAL_TABLET | Freq: Once | ORAL | Status: AC
  Administered 2022-09-27: 650 mg via ORAL

## 2022-09-27 MED ORDER — METHYLPREDNISOLONE SODIUM SUCC 125 MG IJ SOLR
INTRAMUSCULAR | Status: AC
  Filled 2022-09-27: qty 2

## 2023-03-12 IMAGING — DX DG CHEST 2V
2 series · 2 of 2 positions shown · non-contrast
Comparison: None.

CLINICAL DATA: Fatigue, concern for infection

EXAM:
CHEST - 2 VIEW

[chest pa]
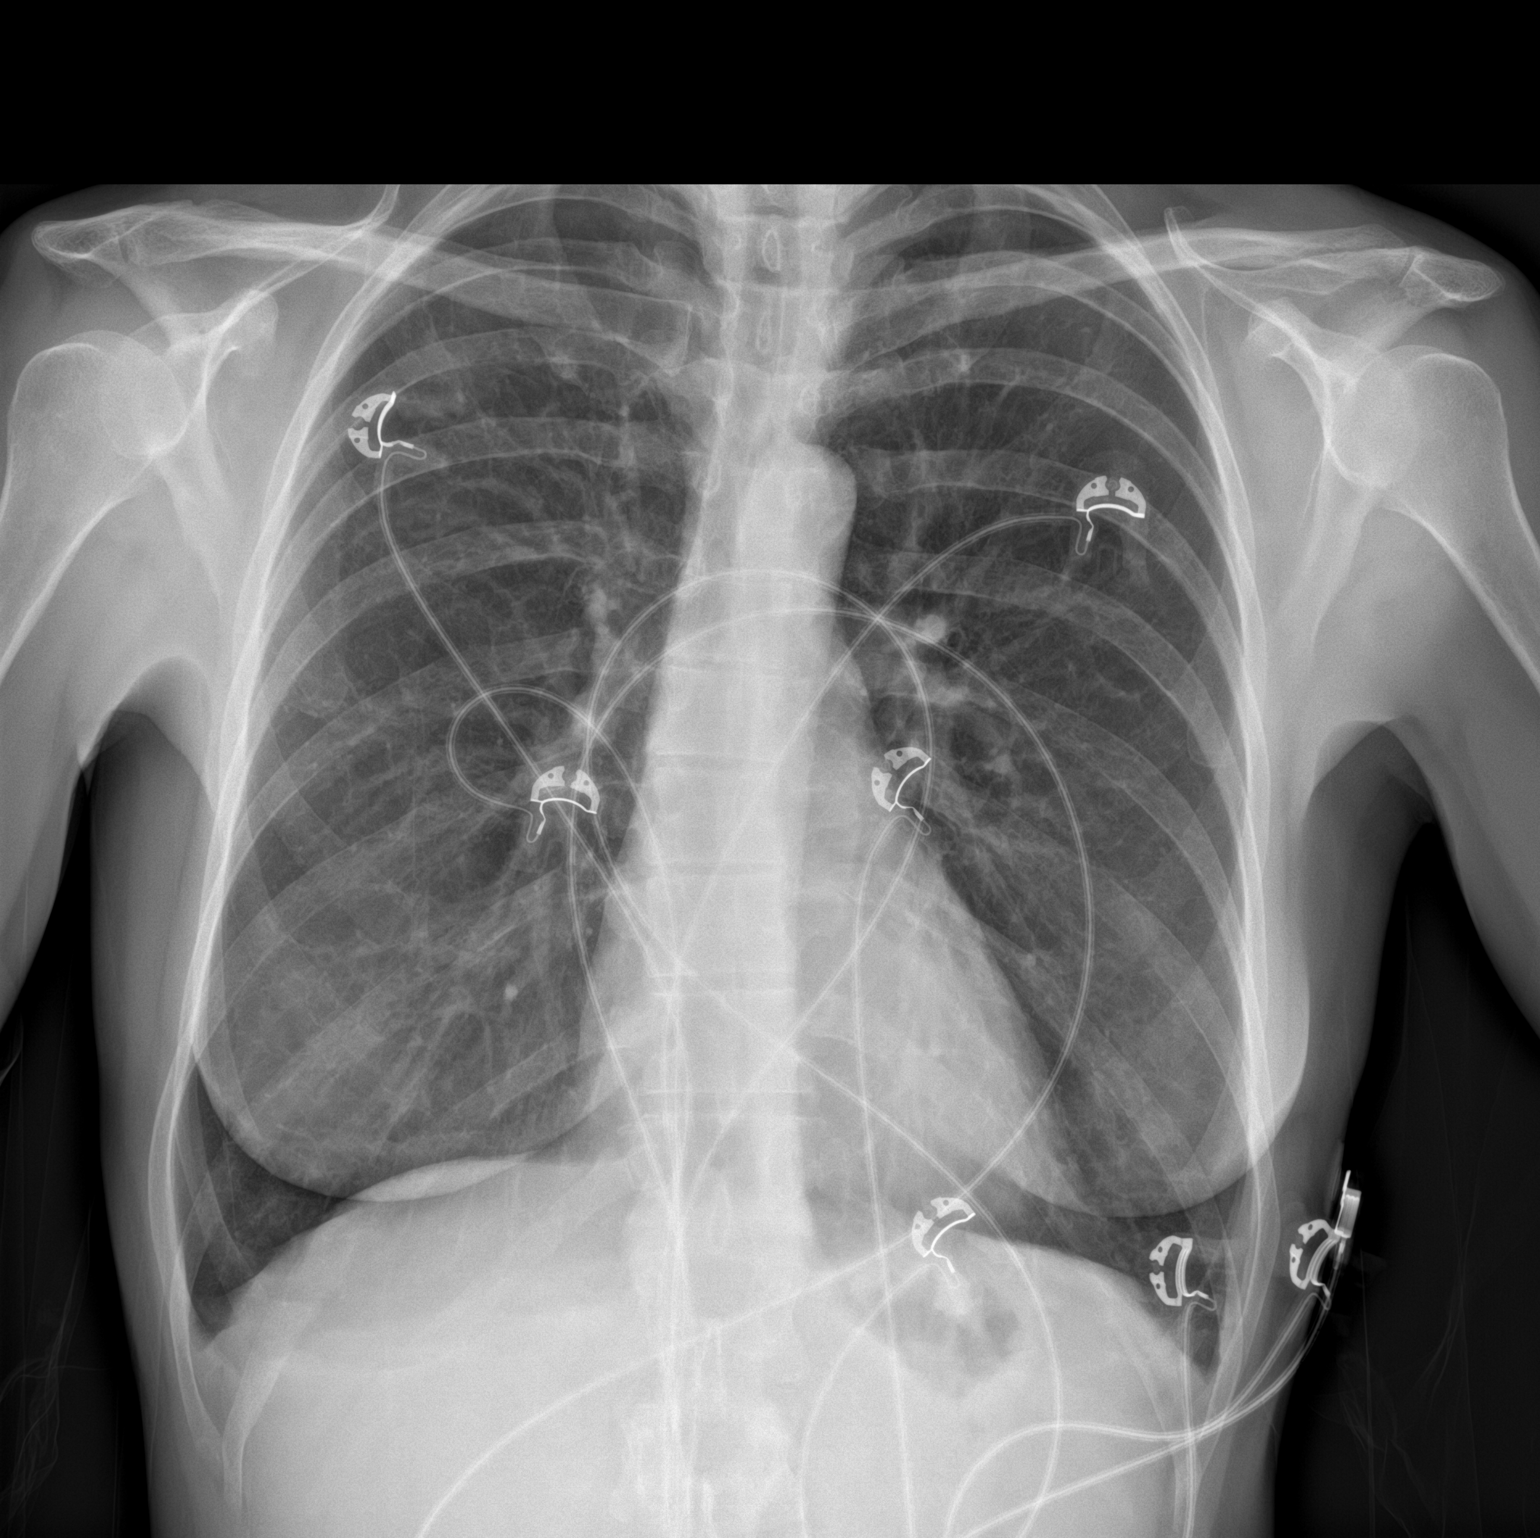

[chest lat]
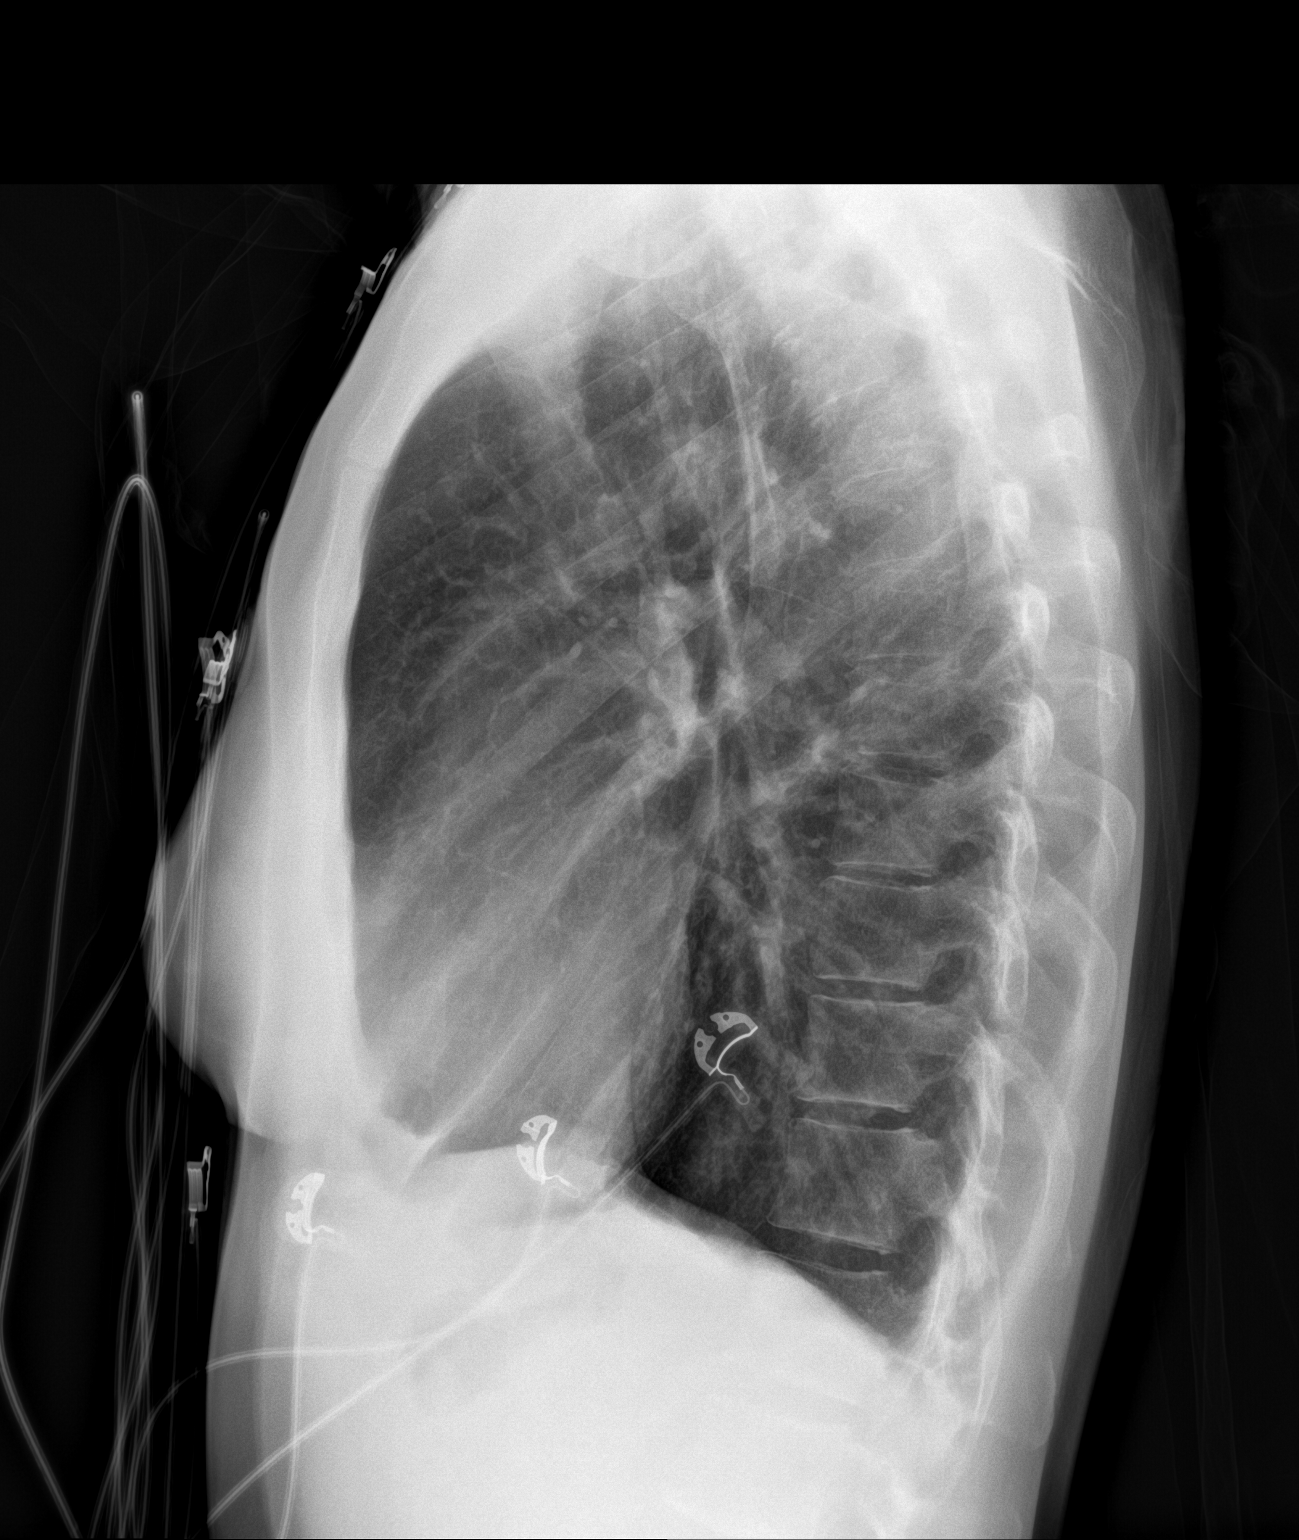

[2 of 2 positions shown; findings below may reference images not displayed]

FINDINGS: The heart size and mediastinal contours are within normal limits.
Both lungs are clear. The visualized skeletal structures are
unremarkable.
IMPRESSION: No active cardiopulmonary disease.

## 2023-03-12 IMAGING — US US RENAL
1 series · 15 of 25 positions shown · non-contrast
Comparison: None.

CLINICAL DATA: Acute kidney injury

EXAM:
RENAL / URINARY TRACT ULTRASOUND COMPLETE

[Series 1: us renal mc & wl · 15 of 45 slices shown]
[im 1/45]
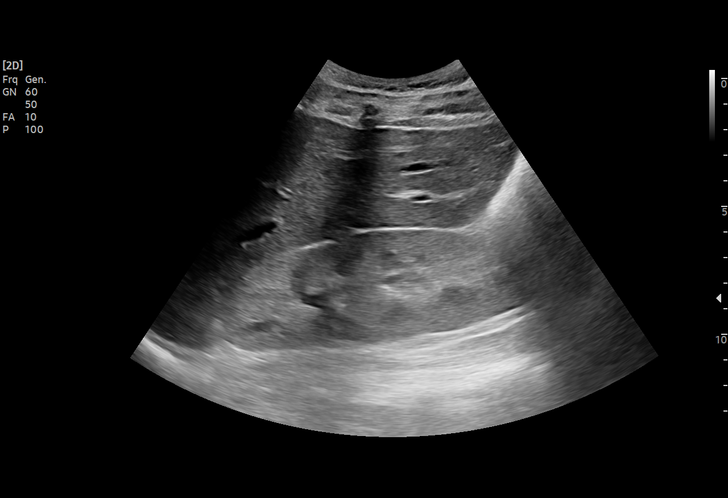
[im 4/45]
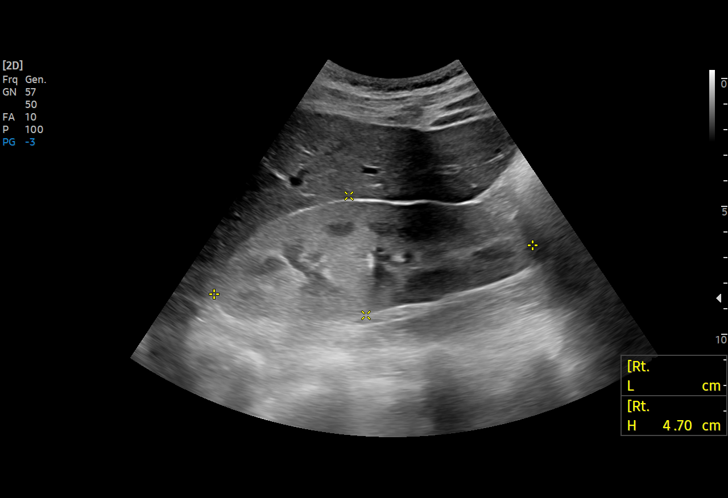
[im 8/45]
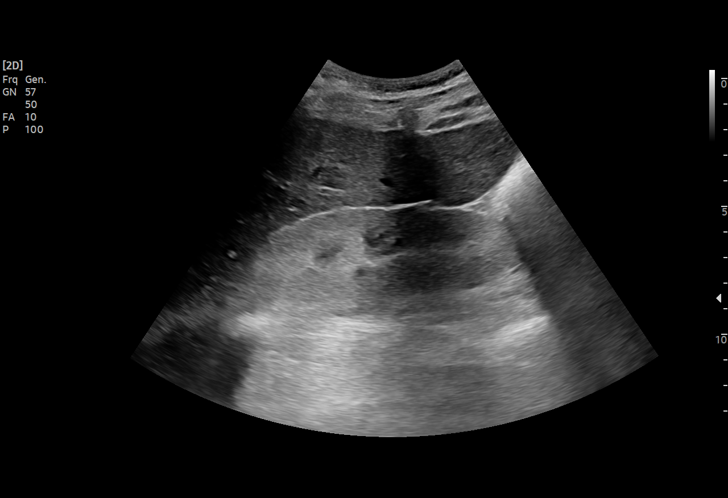
[im 10/45]
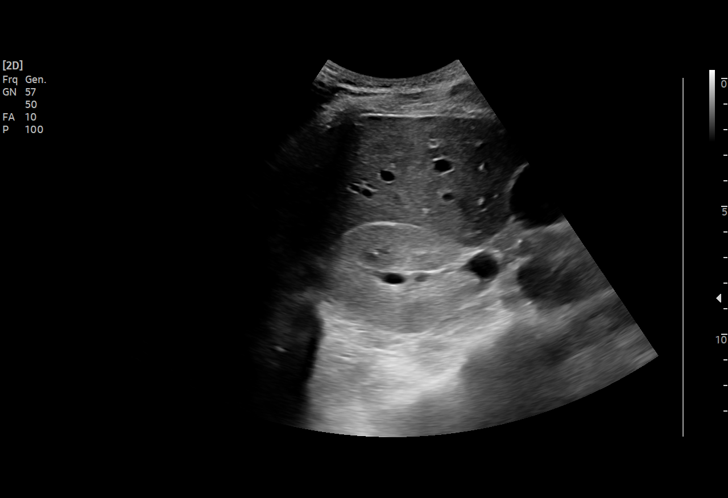
[im 13/45]
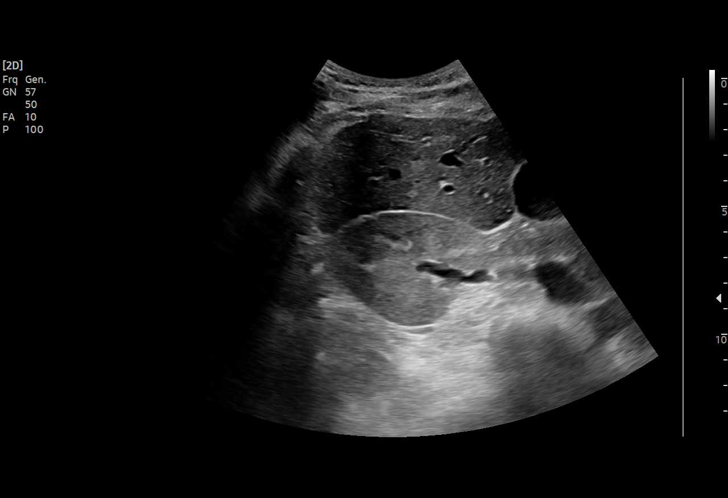
[im 17/45]
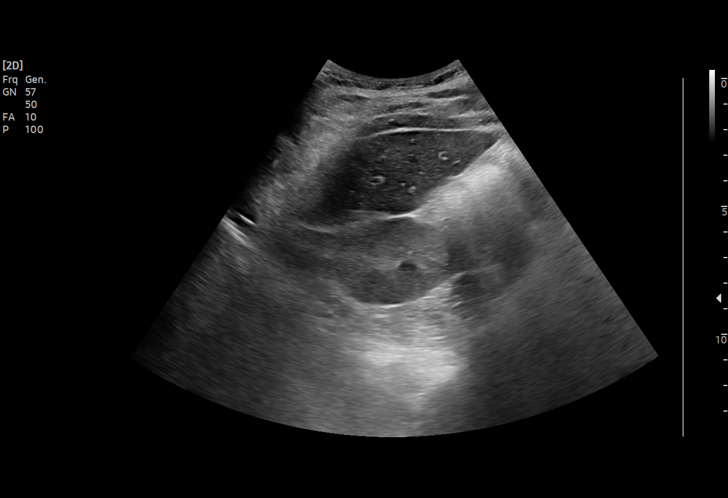
[im 19/45]
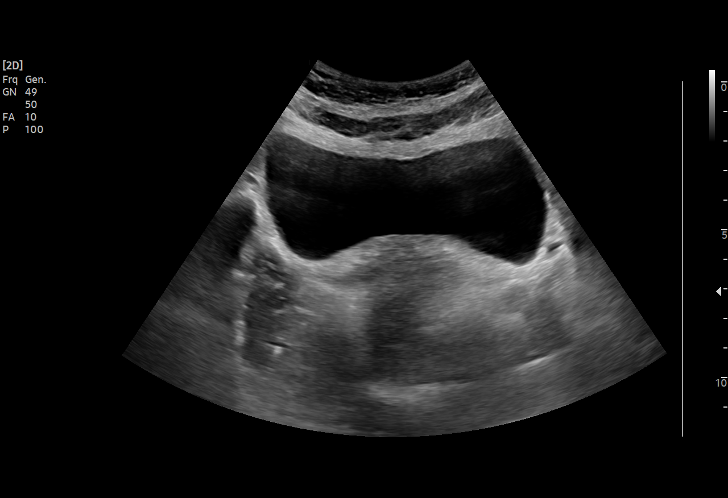
[im 23/45]
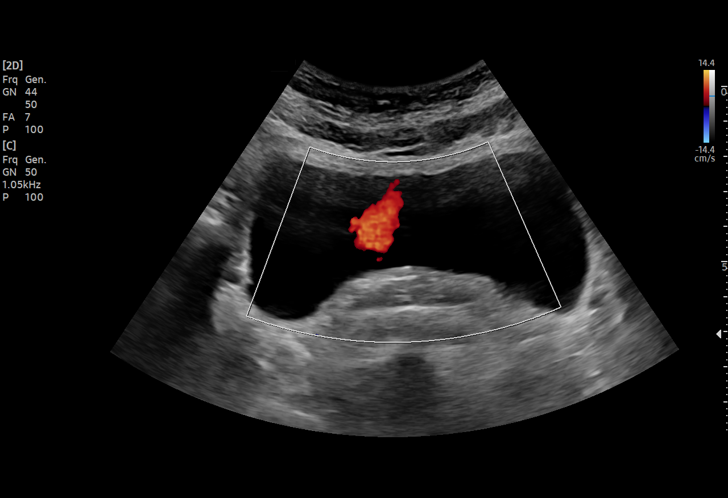
[im 26/45]
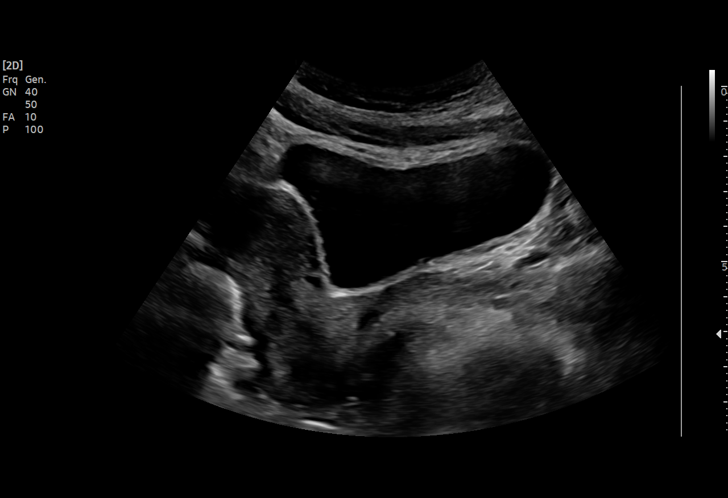
[im 28/45]
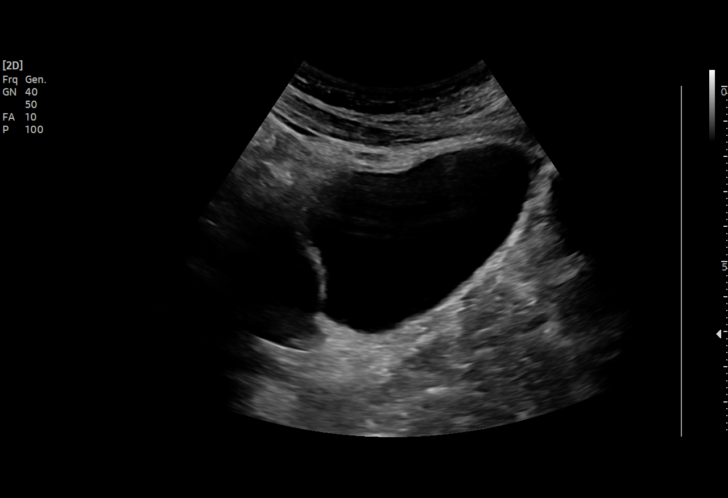
[im 32/45]
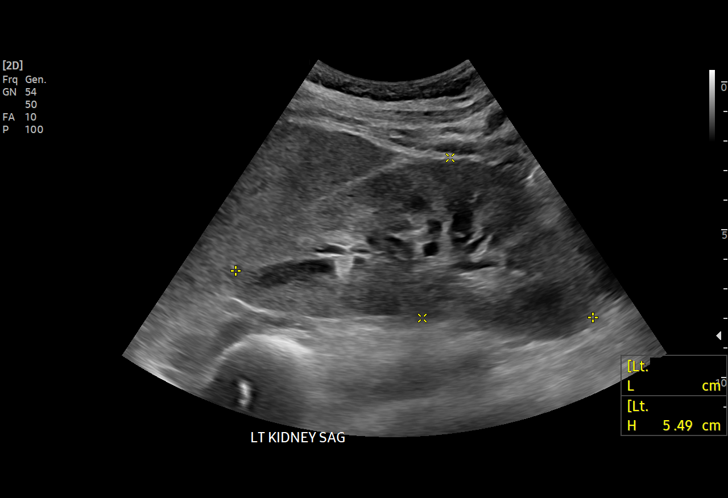
[im 35/45]
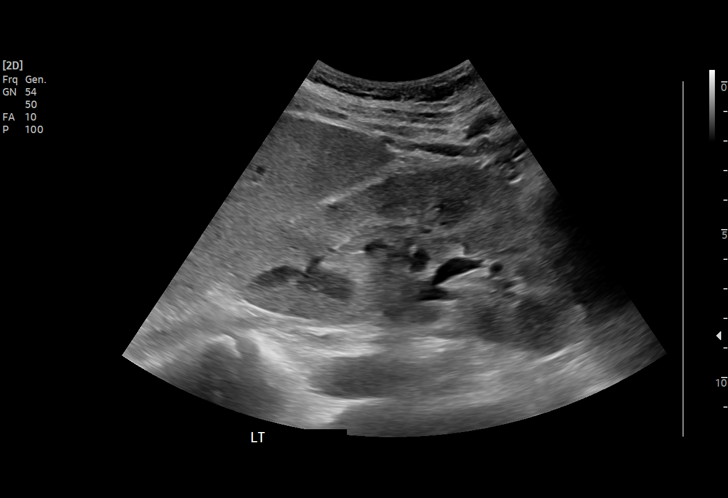
[im 37/45]
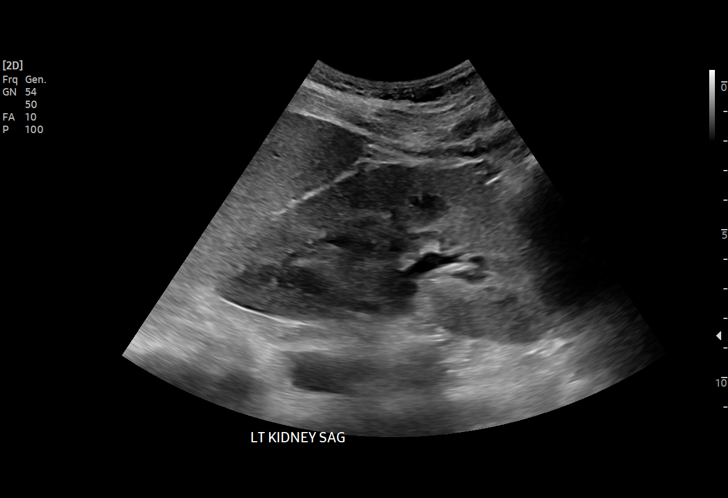
[im 41/45]
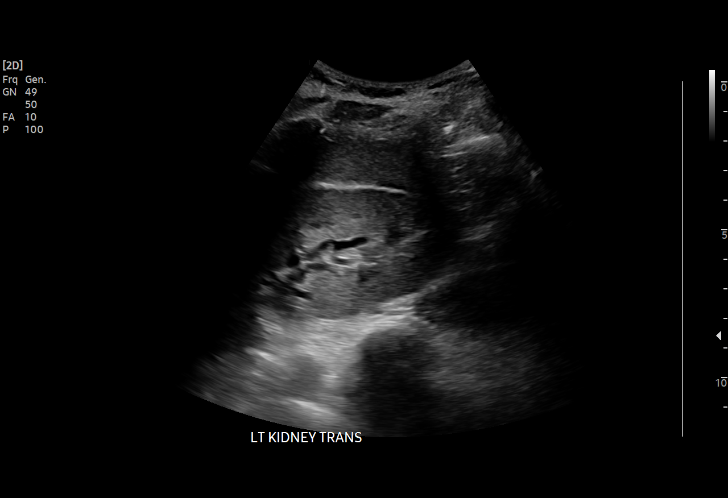
[im 45/45]
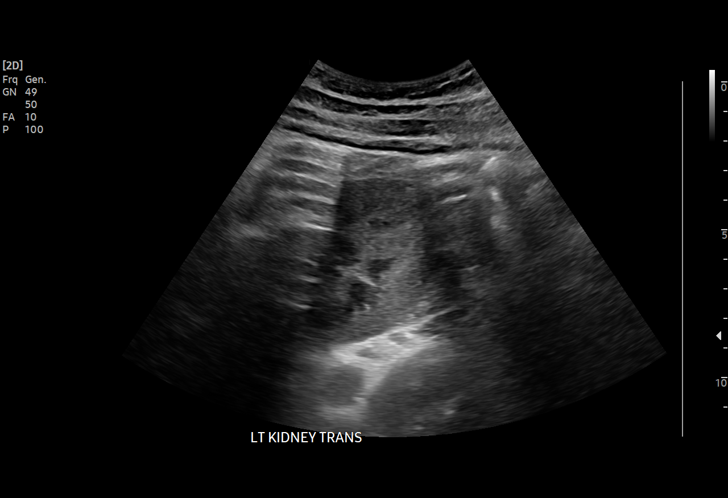

[15 of 25 positions shown; findings below may reference images not displayed]

FINDINGS: Right Kidney:

Renal measurements: 12.6 x 4.7 x 6.0 cm = volume: 185 mL. Echogenic
renal parenchyma. No mass or hydronephrosis.

Left Kidney:

Renal measurements: 12.1 x 5.5 x 5.7 cm = volume: 198 mL. Echogenic
renal parenchyma. No mass or hydronephrosis.

Bladder:

Appears normal for degree of bladder distention.

Other:

None.
IMPRESSION: Echogenic renal parenchyma, suggesting medical renal disease. No
hydronephrosis.

## 2023-03-26 IMAGING — US US BIOPSY
1 series · 13 of 23 positions shown · non-contrast
Comparison: None Available.

INDICATION: 50-year-old female with acute kidney injury and possible autoimmune
nephritis. She presents for random renal biopsy.

EXAM:
ULTRASOUND GUIDED RENAL BIOPSY

[Series 1: us biopsy (kidney) · 13 of 23 slices shown]
[im 1/23]
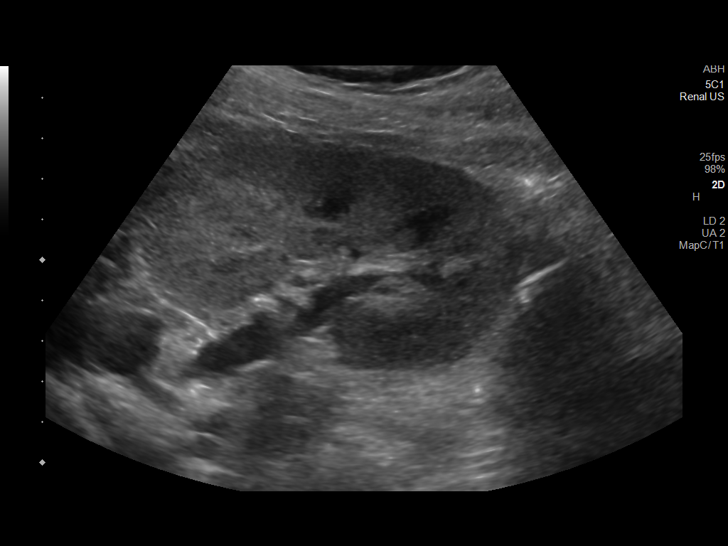
[im 3/23]
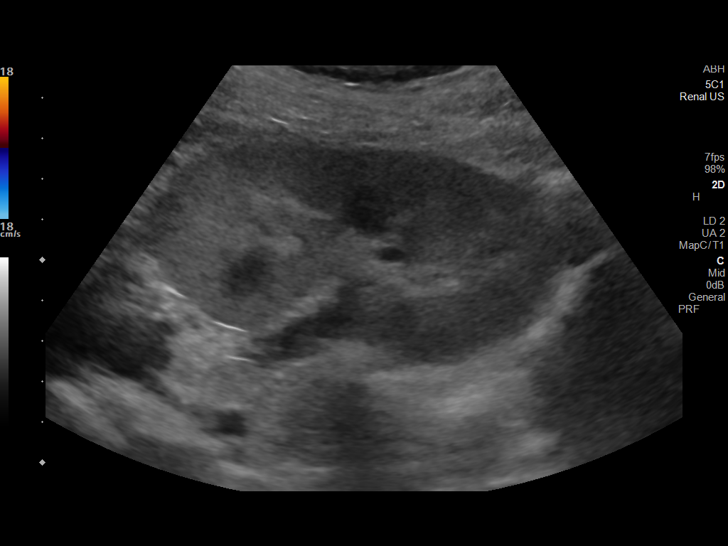
[im 5/23]
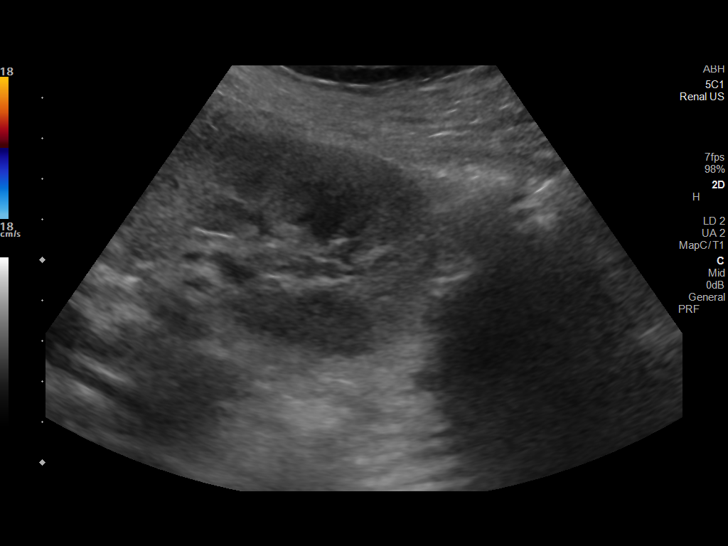
[im 7/23]
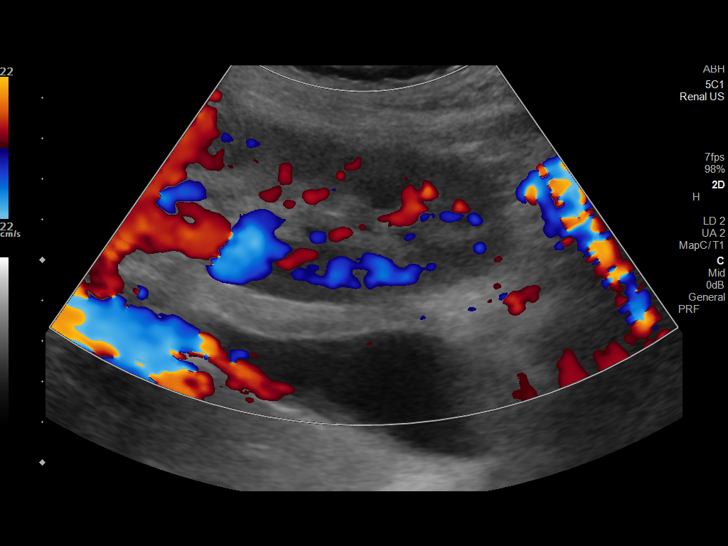
[im 8/23]
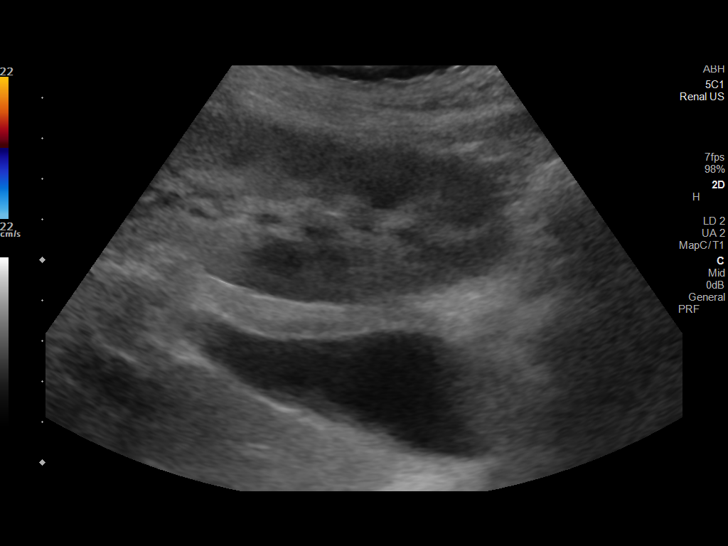
[im 10/23]
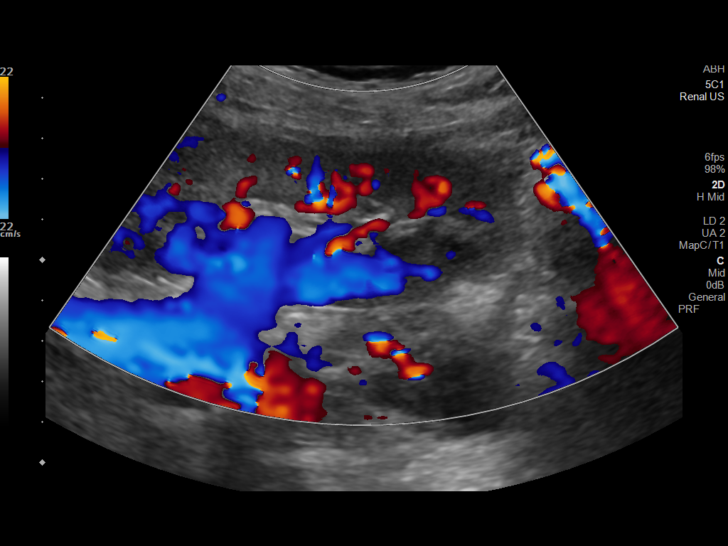
[im 12/23]
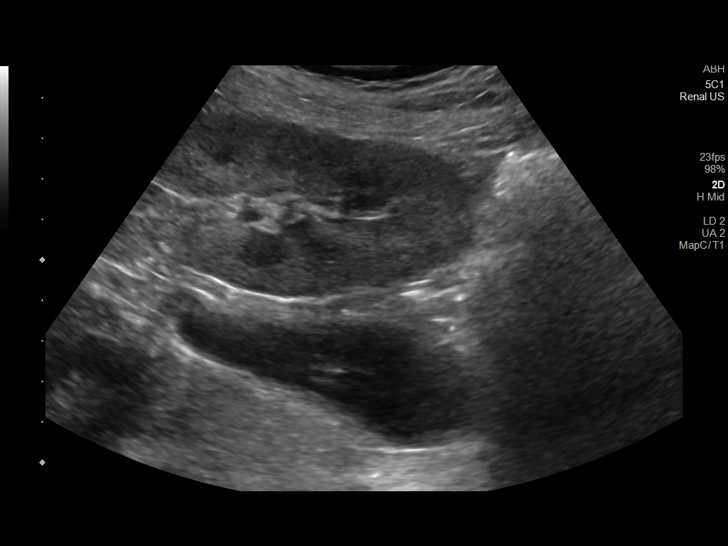
[im 14/23]
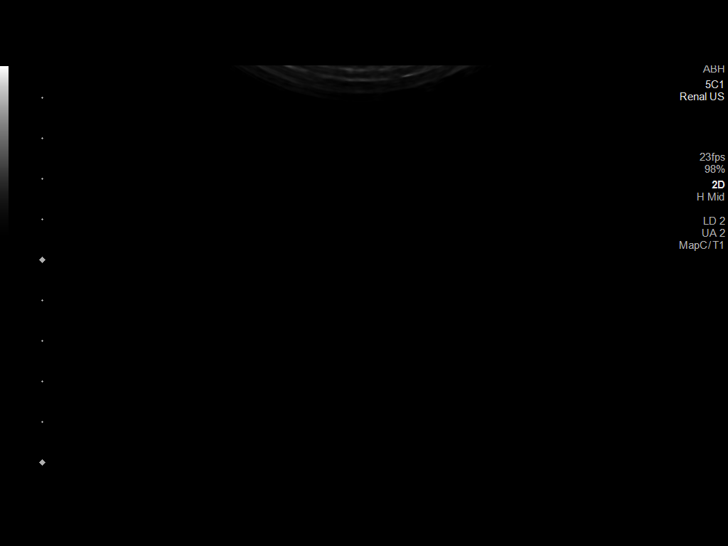
[im 16/23]
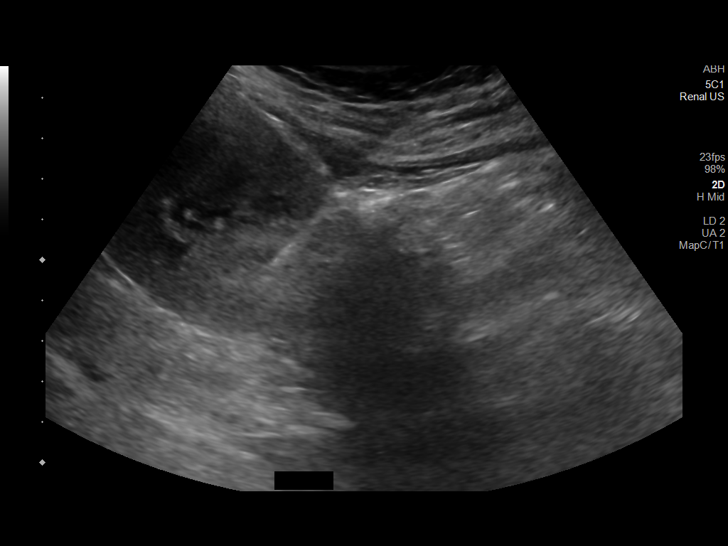
[im 17/23]
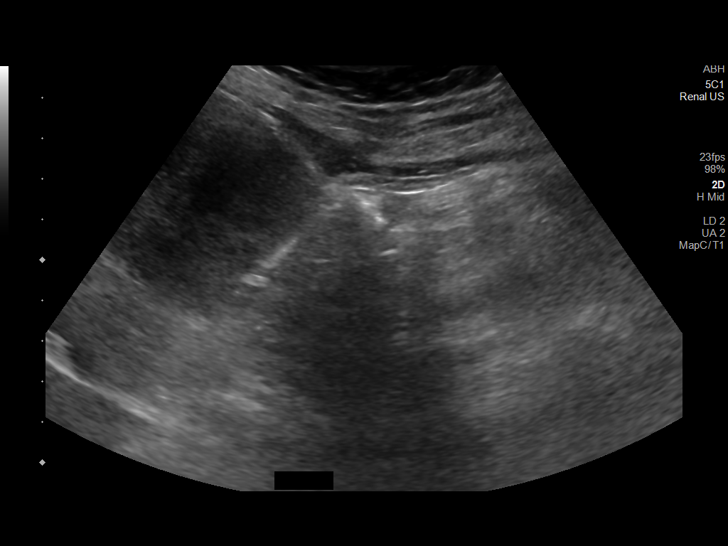
[im 19/23]
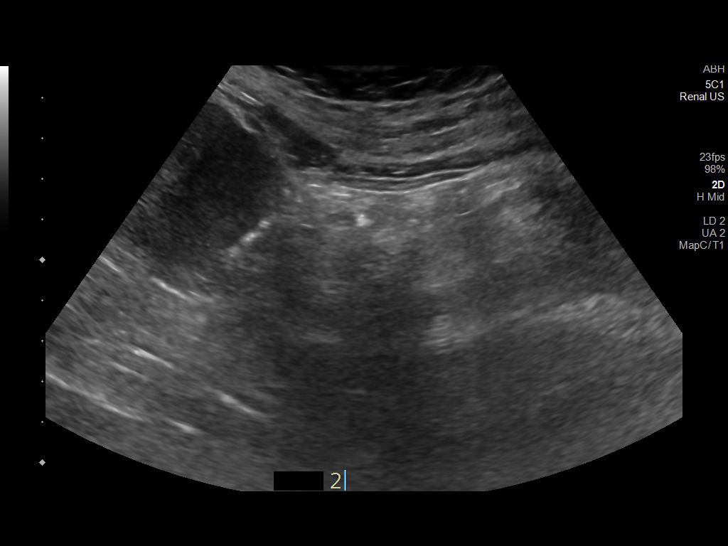
[im 21/23]
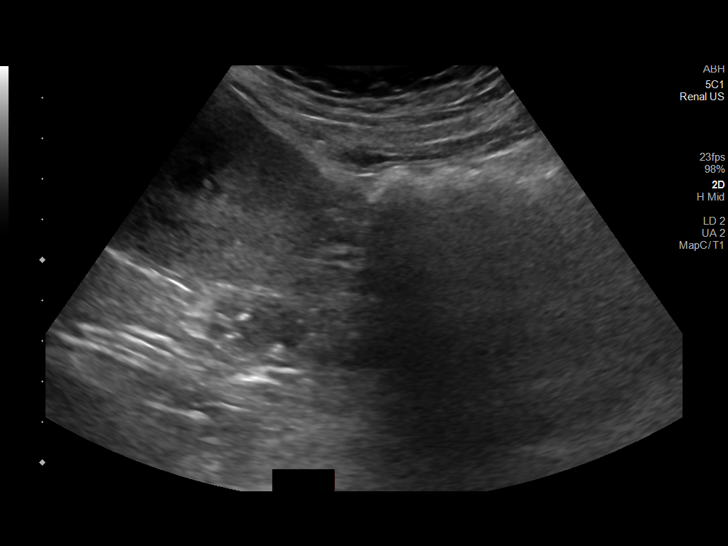
[im 23/23]
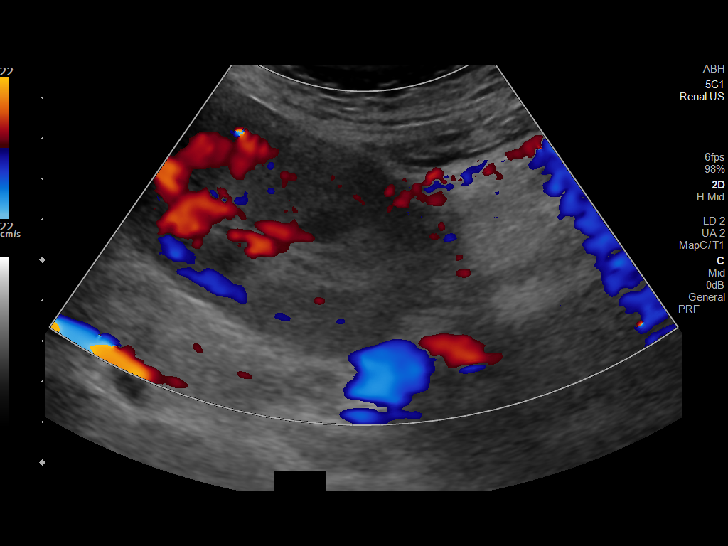

[13 of 23 positions shown; findings below may reference images not displayed]

MEDICATIONS:
Fentanyl 150 mcg IV; Versed 5 mg IV

ANESTHESIA/SEDATION:
The patient's vital signs and level of consciousness were monitored
continuously by radiology nursing under my direct supervision.

Total Moderate Sedation time

15 minutes

COMPLICATIONS:
None immediate

PROCEDURE:
Informed written consent was obtained from the patient after a
discussion of the risks, benefits and alternatives to treatment. The
patient understands and consents the procedure. A timeout was
performed prior to the initiation of the procedure.

Ultrasound scanning was performed of the bilateral flanks. The
inferior pole of the left kidney was selected for biopsy due to
location and sonographic window. The procedure was planned. The
operative site was prepped and draped in the usual sterile fashion.
The overlying soft tissues were anesthetized with 1% lidocaine with
epinephrine. A 17 gauge core needle biopsy device was advanced into
the inferior cortex of the left kidney and 2 core biopsies were
obtained under direct ultrasound guidance. Images were saved for
documentation purposes. The biopsy device was removed and hemostasis
was obtained with manual compression. Post procedural scanning was
negative for significant post procedural hemorrhage or additional
complication. A dressing was placed. The patient tolerated the
procedure well without immediate post procedural complication.
IMPRESSION: Technically successful ultrasound guided left renal biopsy.

## 2023-03-28 ENCOUNTER — Other Ambulatory Visit (HOSPITAL_COMMUNITY): Payer: Self-pay

## 2023-03-29 ENCOUNTER — Ambulatory Visit (HOSPITAL_COMMUNITY)
Admission: RE | Admit: 2023-03-29 | Discharge: 2023-03-29 | Disposition: A | Discharge status: Home / Self Care | Payer: 59 | Source: Ambulatory Visit | Attending: Internal Medicine | Admitting: Internal Medicine

## 2023-03-29 DIAGNOSIS — I7782 Antineutrophilic cytoplasmic antibody (ANCA) vasculitis: Secondary | ICD-10-CM | POA: Insufficient documentation

## 2023-03-29 MED ORDER — METHYLPREDNISOLONE SODIUM SUCC 125 MG IJ SOLR
INTRAMUSCULAR | Status: AC
  Filled 2023-03-29: qty 2

## 2023-03-29 MED ORDER — DIPHENHYDRAMINE HCL 50 MG/ML IJ SOLN
25.0000 mg | Freq: Once | INTRAMUSCULAR | Status: AC
  Administered 2023-03-29: 25 mg via INTRAVENOUS

## 2023-03-29 MED ORDER — METHYLPREDNISOLONE SODIUM SUCC 125 MG IJ SOLR
125.0000 mg | Freq: Once | INTRAMUSCULAR | Status: AC
  Administered 2023-03-29: 125 mg via INTRAVENOUS

## 2023-03-29 MED ORDER — SODIUM CHLORIDE 0.9 % IV SOLN
1000.0000 mg | Freq: Once | INTRAVENOUS | Status: AC
  Administered 2023-03-29: 1000 mg via INTRAVENOUS
  Filled 2023-03-29: qty 100

## 2023-03-29 MED ORDER — ACETAMINOPHEN 325 MG PO TABS
650.0000 mg | ORAL_TABLET | Freq: Once | ORAL | Status: AC
  Administered 2023-03-29: 650 mg via ORAL

## 2023-03-29 MED ORDER — ACETAMINOPHEN 325 MG PO TABS
ORAL_TABLET | ORAL | Status: AC
  Filled 2023-03-29: qty 2

## 2023-03-29 MED ORDER — DIPHENHYDRAMINE HCL 50 MG/ML IJ SOLN
INTRAMUSCULAR | Status: AC
  Filled 2023-03-29: qty 1

## 2023-03-29 NOTE — Progress Notes (Signed)
Spoke with [place] at [facility] regarding TB test and do we need to repeat since it was last done 10/12/21.  Per Dr Glenna Fellows, pt is low risk and we do not need a repeat quant gold on her.

## 2023-05-23 ENCOUNTER — Telehealth (INDEPENDENT_AMBULATORY_CARE_PROVIDER_SITE_OTHER): Payer: Self-pay | Admitting: Otolaryngology

## 2023-05-23 NOTE — Telephone Encounter (Signed)
LVM to confirm appt & location [phone] afm

## 2023-05-24 ENCOUNTER — Ambulatory Visit (INDEPENDENT_AMBULATORY_CARE_PROVIDER_SITE_OTHER): Payer: 59 | Admitting: Audiology

## 2023-05-24 ENCOUNTER — Ambulatory Visit (INDEPENDENT_AMBULATORY_CARE_PROVIDER_SITE_OTHER): Payer: 59

## 2023-05-24 ENCOUNTER — Encounter (INDEPENDENT_AMBULATORY_CARE_PROVIDER_SITE_OTHER): Payer: Self-pay

## 2023-05-24 VITALS — BP 134/81 | Ht 64.0 in | Wt 141.0 lb

## 2023-05-24 DIAGNOSIS — H9319 Tinnitus, unspecified ear: Secondary | ICD-10-CM | POA: Diagnosis not present

## 2023-05-24 DIAGNOSIS — H903 Sensorineural hearing loss, bilateral: Secondary | ICD-10-CM

## 2023-05-24 DIAGNOSIS — H9313 Tinnitus, bilateral: Secondary | ICD-10-CM

## 2023-05-24 NOTE — Progress Notes (Signed)
  [address], [address] [place], [place] [address] [phone]  Audiological Evaluation    Name: Lynn Mcdonald     DOB:   1970/06/22      MRN:   [id number]                                                                                     Service Date: 05/24/2023     Accompanied by: unaccompanied    Patient comes today after Dr. Suszanne Conners, ENT sent a referral for a hearing evaluation due to concerns with hearing loss.   Symptoms Yes Details  Hearing loss  []    Tinnitus  [x]  Right ear , intermittent, onset 1 year ago  Ear pain/ Ear infections  []    Balance problems  []    Noise exposure  []    Previous ear surgeries  []    Family history  []    Amplification  [x]  Has a set of hearing aids  Other  []      Otoscopy: Right ear: Clear external ear canals and notable landmarks visualized on the tympanic membrane. Left ear:  Clear external ear canals and notable landmarks visualized on the tympanic membrane.  Tympanometry: Right ear: Type A- Normal external ear canal volume with normal middle ear pressure and tympanic membrane compliance Left ear: Type A- Normal external ear canal volume with normal middle ear pressure and tympanic membrane compliance    Pure tone Audiometry: Right ear- Normal to severe sensorineural hearing loss from 250 Hz - 8000 Hz. Left ear-  Mild to severe sensorineural hearing loss from 250 Hz - 8000 Hz.  The hearing test results were completed under headphones and results are deemed to be of good reliability. Test technique:  conventional     Speech Audiometry: Right ear- Speech Reception Threshold (SRT) was obtained at 25 dBHL Left ear-Speech Reception Threshold (SRT) was obtained at 40 dBHL   Word Recognition Score Tested using NU-6 (MLV) Right ear: 96% was obtained at a presentation level of 65 dBHL with contralateral masking which is deemed as  excellent Left ear: 80% was obtained at a presentation level of 80 dBHL with contralateral masking which  is deemed as  good     Impression: There is a significant difference in pure-tone thresholds between ears. This difference has been observed in the past audiograms. No significant changes noted.   Recommendations: Follow up with ENT as scheduled for today., Return for a hearing evaluation if concerns with hearing changes arise or per MD recommendation.   Ranada Vigorito MARIE LEROUX-MARTINEZ, AUD

## 2023-05-25 DIAGNOSIS — H9313 Tinnitus, bilateral: Secondary | ICD-10-CM | POA: Insufficient documentation

## 2023-05-25 DIAGNOSIS — H903 Sensorineural hearing loss, bilateral: Secondary | ICD-10-CM | POA: Insufficient documentation

## 2023-05-25 NOTE — Progress Notes (Signed)
Patient ID: Lynn Mcdonald, female   DOB: 20-Dec-1970, 53 y.o.   MRN: [id number]  Follow-up: Hearing loss, tinnitus  HPI: The patient is a 53 year old female who presents today for her follow-up evaluation.  She was last seen in November 2023.  At that time, she was complaining of left ear hearing loss and bilateral tinnitus.  She was noted to have bilateral high-frequency sensorineural hearing loss, worse on the left side.  Her subsequent MRI scan was negative for retrocochlear lesion.  She was fitted with bilateral hearing aids.  The patient returns today complaining of persistent bilateral tinnitus.  She continues to have hearing difficulty on the left side.  The hearing aids have helped.  Currently she denies any otalgia, otorrhea, or vertigo.  Exam: General: Communicates without difficulty, well nourished, no acute distress. Head: Normocephalic, no evidence injury, no tenderness, facial buttresses intact without stepoff. Face/sinus: No tenderness to palpation and percussion. Facial movement is normal and symmetric. Eyes: PERRL, EOMI. No scleral icterus, conjunctivae clear. Neuro: CN II exam reveals vision grossly intact.  No nystagmus at any point of gaze. Ears: Auricles well formed without lesions.  Ear canals are intact without mass or lesion.  No erythema or edema is appreciated.  The TMs are intact without fluid. Nose: External evaluation reveals normal support and skin without lesions.  Dorsum is intact.  Anterior rhinoscopy reveals normal mucosa over anterior aspect of inferior turbinates and intact septum.  No purulence noted. Oral:  Oral cavity and oropharynx are intact, symmetric, without erythema or edema.  Mucosa is moist without lesions. Neck: Full range of motion without pain.  There is no significant lymphadenopathy.  No masses palpable.  Thyroid bed within normal limits to palpation.  Parotid glands and submandibular glands equal bilaterally without mass.  Trachea is midline. Neuro:  CN  2-12 grossly intact.   Her hearing test shows stable bilateral high-frequency sensorineural hearing loss, worse on the left side.  Assessment: 1.  Bilateral stable high-frequency sensorineural hearing loss, worse on the left side.  Her previous MRI scan was negative for retrocochlear lesion. 2.  Her tinnitus is likely a result of her hearing loss. 3.  Her ear canals, tympanic membranes, and middle ear spaces are all normal.  Plan: 1.  The physical exam findings and the hearing test results are reviewed with the patient. 2.  The patient is reassured that her hearing is stable. 3.  The strategies to cope with tinnitus, including the use of masker, hearing aids, tinnitus retraining therapy, and avoidance of caffeine and alcohol are discussed. 4.  The patient will return for reevaluation in 1 year.

## 2023-10-31 ENCOUNTER — Telehealth (INDEPENDENT_AMBULATORY_CARE_PROVIDER_SITE_OTHER): Payer: Self-pay | Admitting: Audiology

## 2023-10-31 NOTE — Telephone Encounter (Signed)
 Patient called and hearing aid is in need of repair.  The wire around the casing is broken.  Patient stated she will drop off the hearing aid for Dr. Tiney to take a look at it.

## 2023-11-12 ENCOUNTER — Ambulatory Visit (INDEPENDENT_AMBULATORY_CARE_PROVIDER_SITE_OTHER): Payer: Self-pay | Admitting: Audiology

## 2023-11-12 DIAGNOSIS — H903 Sensorineural hearing loss, bilateral: Secondary | ICD-10-CM

## 2023-11-12 NOTE — Progress Notes (Signed)
  [address], [address] [place], [place] [address] [phone]  Hearing Aid Check     Lynn Mcdonald comes as a walk-in to drop her right hearing aid off.    Accompanied ab:[id number]   Right Left  Hearing aid manufacturer [employer] Intent 3 miniRITE SN: [id number] [employer] Intent 3 miniRITE SN: [id number]  Hearing aid style Receiver in the ear   Hearing aid battery rechargeable   Receiver 1-85   Dome/ custom earpiece 6mm closed   Retention wire no   Warranty expiration date 07-25-25   Loss and Damage unknown   Initial fitting date 06-28-2022 06-28-2022  [facility] was fit at: [facility]    Chief complaint: Patient drops he aid because the receiver keeps spinning around. .  Actions taken: Replaced right receiver wire with a new one because hers was breaking. Patient had glued the receiver to the aid , but I was able to take the receiver out and put a new one in.  Services fee: $0 was paid at checkout.     Recommend: Return for a hearing aid check , as needed. Return for a hearing evaluation and to see an ENT, if concerns with hearing changes arise.    Dwyne Hasegawa MARIE LEROUX-MARTINEZ, AUD
